# Patient Record
Sex: Male | Born: 1975
Health system: Southern US, Community
[De-identification: ages and names within clinical notes are randomized; demographics above are authoritative.]

## PROBLEM LIST (undated history)

## (undated) DIAGNOSIS — M109 Gout, unspecified: Secondary | ICD-10-CM

## (undated) DIAGNOSIS — I10 Essential (primary) hypertension: Secondary | ICD-10-CM

## (undated) DIAGNOSIS — A879 Viral meningitis, unspecified: Secondary | ICD-10-CM

## (undated) DIAGNOSIS — G039 Meningitis, unspecified: Secondary | ICD-10-CM

## (undated) HISTORY — DX: Essential (primary) hypertension: I10

## (undated) HISTORY — PX: BICEPS TENDON REPAIR: SHX566

---

## 2009-06-26 DIAGNOSIS — M109 Gout, unspecified: Secondary | ICD-10-CM

## 2009-06-26 HISTORY — DX: Gout, unspecified: M10.9

## 2012-03-13 ENCOUNTER — Emergency Department (HOSPITAL_COMMUNITY)
Admission: EM | Admit: 2012-03-13 | Discharge: 2012-03-13 | Disposition: A | Discharge status: Home / Self Care | Payer: Self-pay | Attending: Emergency Medicine | Admitting: Emergency Medicine

## 2012-03-13 DIAGNOSIS — Z8661 Personal history of infections of the central nervous system: Secondary | ICD-10-CM | POA: Insufficient documentation

## 2012-03-13 DIAGNOSIS — F17200 Nicotine dependence, unspecified, uncomplicated: Secondary | ICD-10-CM | POA: Insufficient documentation

## 2012-03-13 DIAGNOSIS — M109 Gout, unspecified: Secondary | ICD-10-CM | POA: Insufficient documentation

## 2012-03-13 DIAGNOSIS — I10 Essential (primary) hypertension: Secondary | ICD-10-CM | POA: Insufficient documentation

## 2012-03-13 HISTORY — DX: Gout, unspecified: M10.9

## 2012-03-13 HISTORY — DX: Viral meningitis, unspecified: A87.9

## 2012-03-13 MED ORDER — PREDNISONE 20 MG PO TABS
60.0000 mg | ORAL_TABLET | Freq: Once | ORAL | Status: DC
  Administered 2012-03-13: 60 mg via ORAL
  Filled 2012-03-13: qty 3

## 2012-03-13 MED ORDER — IBUPROFEN 800 MG PO TABS
800.0000 mg | ORAL_TABLET | Freq: Once | ORAL | Status: DC
  Administered 2012-03-13: 800 mg via ORAL
  Filled 2012-03-13: qty 1

## 2012-03-13 MED ORDER — PREDNISONE 20 MG PO TABS: 60.0000 mg | ORAL_TABLET | Freq: Every day | ORAL | Status: DC

## 2012-03-13 MED ORDER — OXYCODONE-ACETAMINOPHEN 5-325 MG PO TABS: 1.0000 | ORAL_TABLET | Freq: Four times a day (QID) | ORAL | Status: DC | PRN

## 2012-03-13 NOTE — ED Notes (Signed)
Pt states gout pain began yesterday to left foot. Also asking if he can have something for a headache. Provider to see pt.

## 2012-03-13 NOTE — ED Provider Notes (Signed)
History  This chart was scribed for Tien B. Bernette Mayers, MD by Shari Heritage. The patient was seen in room [facility]. Patient's care was started at 1504.     CSN: [id number]  Arrival date & time 03/13/12  1152   First MD Initiated Contact with Patient 03/13/12 1504      Chief Complaint  Patient presents with  . Gout    The history is provided by the patient. No language interpreter was used.    Walter Stuart is a 36 y.o. male who presents to the Emergency Department complaining of moderate, constant, non-radiating left big toe pain onset yesterday. Patient denies any obvious injury. Patient has a history of gout in his left elbow, but he doesn't take any medications for treatment. Patient has been wearing a boot on his foot, but it hasn't provided any relief.  Patient is also complaining of a moderate HA. Other medical history includes HTN and viral meningitis.  Past Medical History  Diagnosis Date  . Hypertension   . Gout   . Viral meningitis     History reviewed. No pertinent past surgical history.  No family history on file.  History  Substance Use Topics  . Smoking status: Current Every Day Smoker  . Smokeless tobacco: Current User  . Alcohol Use: 1.2 oz/week    2 Cans of beer per week      Review of Systems A complete 10 system review of systems was obtained and all systems are negative except as noted in the HPI and PMH.   Allergies  Review of patient's allergies indicates no known allergies.  Home Medications  No current outpatient prescriptions on file.  BP 149/79  Pulse 81  Temp 98.5 F (36.9 C) (Oral)  Resp 20  Ht 5\' 6"  (1.676 m)  Wt 285 lb (129.275 kg)  BMI 46.00 kg/m2  SpO2 98%  Physical Exam  Nursing note and vitals reviewed. Constitutional: He is oriented to person, place, and time. He appears well-developed and well-nourished.  HENT:  Head: Normocephalic and atraumatic.  Eyes: EOM are normal. Pupils are equal, round, and reactive to  light.  Neck: Normal range of motion. Neck supple.  Cardiovascular: Normal rate, normal heart sounds and intact distal pulses.   Pulmonary/Chest: Effort normal and breath sounds normal.  Abdominal: Bowel sounds are normal. He exhibits no distension. There is no tenderness.  Musculoskeletal: Normal range of motion. He exhibits edema (Pain and swelling to L great toe) and tenderness.  Neurological: He is alert and oriented to person, place, and time. He has normal strength. No cranial nerve deficit or sensory deficit.  Skin: Skin is warm and dry. No rash noted.  Psychiatric: He has a normal mood and affect.    ED Course  Procedures (including critical care time) DIAGNOSTIC STUDIES: Oxygen Saturation is 98% on room air, normal by my interpretation.    COORDINATION OF CARE: 3:23pm- Patient informed of current plan for treatment and evaluation and agrees with plan at this time.    Labs Reviewed - No data to display  No results found.   1. Gout       MDM  Pt with history of gout, has had flare in L great toe for several days. Worse with movement and difficulty walking.      I personally performed the services described in the documentation, which were scribed in my presence. The recorded information has been reviewed and considered.     Zyeir B. Bernette Mayers, MD 03/13/12 [phone]

## 2012-03-13 NOTE — ED Notes (Signed)
Patient with h/o gout. C/o similar pain in left foot/ankle. Patient denies any injury.

## 2013-02-24 ENCOUNTER — Emergency Department (HOSPITAL_COMMUNITY)
Admission: EM | Admit: 2013-02-24 | Discharge: 2013-02-24 | Disposition: A | Discharge status: Home / Self Care | Payer: Self-pay | Attending: Emergency Medicine | Admitting: Emergency Medicine

## 2013-02-24 ENCOUNTER — Encounter (HOSPITAL_COMMUNITY): Payer: Self-pay | Admitting: *Deleted

## 2013-02-24 DIAGNOSIS — M109 Gout, unspecified: Secondary | ICD-10-CM | POA: Insufficient documentation

## 2013-02-24 DIAGNOSIS — F17200 Nicotine dependence, unspecified, uncomplicated: Secondary | ICD-10-CM | POA: Insufficient documentation

## 2013-02-24 DIAGNOSIS — Z8661 Personal history of infections of the central nervous system: Secondary | ICD-10-CM | POA: Insufficient documentation

## 2013-02-24 HISTORY — DX: Gout, unspecified: M10.9

## 2013-02-24 HISTORY — DX: Meningitis, unspecified: G03.9

## 2013-02-24 MED ORDER — OXYCODONE-ACETAMINOPHEN 5-325 MG PO TABS: 1.0000 | ORAL_TABLET | ORAL | Status: DC | PRN

## 2013-02-24 MED ORDER — PREDNISONE 20 MG PO TABS: ORAL_TABLET | ORAL | Status: DC

## 2013-02-24 NOTE — ED Provider Notes (Signed)
CSN: [id number]     Arrival date & time 02/24/13  1454 History   First MD Initiated Contact with Patient 02/24/13 1529     Chief Complaint  Patient presents with  . Foot Pain   (Consider location/radiation/quality/duration/timing/severity/associated sxs/prior Treatment) HPI Comments: Dellis Voght is a 37 y.o. male who presents to the Emergency Department complaining of pain and swelling to the right great toe that began 3-4 days ago.  States he has hx of gout and pain is similar to previous gout attack.  Pain is worse with weight bearing and improves with rest.  He has been taking Ibuprofen 800 mg w/o relief.  He denies injury, skin lesions or open wounds to his foot.  States the great toe is the only part of the foot that is painful.    Patient is a 37 y.o. male presenting with lower extremity pain. The history is provided by the patient.  Foot Pain This is a recurrent problem. The current episode started in the past 7 days. The problem occurs constantly. The problem has been unchanged. Associated symptoms include arthralgias and joint swelling. Pertinent negatives include no chills, fever, neck pain, numbness, rash, sore throat, vomiting or weakness. The symptoms are aggravated by standing and walking. He has tried nothing for the symptoms. The treatment provided no relief.    Past Medical History  Diagnosis Date  . Gout   . Meningitis    History reviewed. No pertinent past surgical history. History reviewed. No pertinent family history. History  Substance Use Topics  . Smoking status: Current Every Day Smoker  . Smokeless tobacco: Not on file  . Alcohol Use: Yes    Review of Systems  Constitutional: Negative for fever and chills.  HENT: Negative for sore throat and neck pain.   Gastrointestinal: Negative for vomiting.  Genitourinary: Negative for dysuria and difficulty urinating.  Musculoskeletal: Positive for joint swelling and arthralgias.  Skin: Negative for color change,  rash and wound.  Neurological: Negative for weakness and numbness.  All other systems reviewed and are negative.    Allergies  Review of patient's allergies indicates no known allergies.  Home Medications  No current outpatient prescriptions on file. BP 143/62  Pulse 77  Temp(Src) 98.1 F (36.7 C) (Oral)  Resp 17  Ht 5\' 6"  (1.676 m)  Wt 285 lb (129.275 kg)  BMI 46.02 kg/m2  SpO2 99% Physical Exam  Nursing note and vitals reviewed. Constitutional: He is oriented to person, place, and time. He appears well-developed and well-nourished. No distress.  HENT:  Head: Normocephalic and atraumatic.  Cardiovascular: Normal rate, regular rhythm, normal heart sounds and intact distal pulses.   No murmur heard. Pulmonary/Chest: Effort normal and breath sounds normal. No respiratory distress.  Musculoskeletal: He exhibits tenderness. He exhibits no edema.  ttp of the proximal right great toe and first metatarsal head.     DP pulse is brisk,distal sensation intact.  No erythema, abrasion, wounds, red streaks or bony deformity.  No proximal tenderness.  Neurological: He is alert and oriented to person, place, and time. He exhibits normal muscle tone. Coordination normal.  Skin: Skin is warm and dry.    ED Course  Procedures (including critical care time) Labs Review Labs Reviewed - No data to display Imaging Review No results found.  MDM  Previous ED chart reviewed.   ttp and mild STS at the head of the first metatarsal of the right foot extending to the great toe. No clinical signs of cellulitis , NV  intact.  Has hx of gout.  Pain reproduced with weight bearing and states pain feels similar to previous gout flares.  Patient appears stable for d/c, agrees to f/u with his PMD   Arlyne Brandes L. Jiovanna Frei, PA-C 02/24/13 1627

## 2013-02-24 NOTE — ED Notes (Signed)
Pain , swelling rt foot, says he has hx of gout, No known injury

## 2013-02-25 NOTE — ED Provider Notes (Signed)
Medical screening examination/treatment/procedure(s) were performed by non-physician practitioner and as supervising physician I was immediately available for consultation/collaboration.   Laray Anger, DO 02/25/13 1511

## 2013-04-23 ENCOUNTER — Telehealth: Payer: Self-pay | Admitting: Orthopedic Surgery

## 2013-04-23 NOTE — Telephone Encounter (Signed)
Walter Stuart called today to ask about an appointment .  He had been to [facility] 02/24/13 for gout.  He does not have any insurance and wanted to know how much money he would need to be seen.  Told him the minimum amount is $125.00 and he would need to make payment arrangements on the balance.  He said he will call back  Later to schedule

## 2013-04-23 NOTE — Telephone Encounter (Signed)
[facility] called today to inquire about an [facility] follow up appointment.  He had been to [facility] 02/24/13

## 2013-08-29 ENCOUNTER — Telehealth: Payer: Self-pay | Admitting: Family Medicine

## 2014-02-12 ENCOUNTER — Ambulatory Visit: Payer: Self-pay | Admitting: Orthopedic Surgery

## 2014-02-13 ENCOUNTER — Encounter (HOSPITAL_COMMUNITY): Payer: Self-pay | Admitting: Pharmacy Technician

## 2014-02-16 ENCOUNTER — Ambulatory Visit: Payer: Self-pay | Admitting: Orthopedic Surgery

## 2014-02-16 NOTE — H&P (Signed)
Walter Stuart is an 38 y.o. male.   Chief Complaint: L shoulder pain HPI: The patient is a 38 year old male who presents today for follow up of their arm pain. The patient is being followed for their left arm pain. They are 5 month(s) out from injury (DOI 08/06/2013). Symptoms reported today include: pain and pain at night. The patient feels that they are doing poorly and report their pain level to be 8 / 10. Current treatment includes: activity modification and NSAIDs. The following medication has been used for pain control: antiinflammatory medication. Note for "Follow-up arm pain": The patient is currently working full duties.  Followup for left elbow and shoulder. Still reports pain in the shoulder, popping, and clicking. Some weakness in the extremity. Overall, the elbow is much better.  Past Medical History  Diagnosis Date  . Gout   . Meningitis     No past surgical history on file.  No family history on file. Social History:  reports that he has been smoking.  He does not have any smokeless tobacco history on file. He reports that he drinks alcohol. He reports that he does not use illicit drugs.  Allergies: No Known Allergies   (Not in a hospital admission)  No results found for this or any previous visit (from the past 48 hour(s)). No results found.  Review of Systems  Constitutional: Negative.   HENT: Negative.   Eyes: Negative.   Respiratory: Negative.   Cardiovascular: Negative.   Gastrointestinal: Negative.   Genitourinary: Negative.   Musculoskeletal: Positive for joint pain.  Skin: Negative.   Neurological: Negative.   Psychiatric/Behavioral: Negative.     There were no vitals taken for this visit. Physical Exam  Constitutional: He is oriented to person, place, and time. He appears well-developed and well-nourished.  HENT:  Head: Normocephalic and atraumatic.  Eyes: Conjunctivae and EOM are normal. Pupils are equal, round, and reactive to light.  Neck:  Normal range of motion. Neck supple.  Cardiovascular: Normal rate and regular rhythm.   Respiratory: Effort normal and breath sounds normal.  GI: Soft. Bowel sounds are normal.  Musculoskeletal:  On exam of the shoulder he has impingement signs of the shoulder. Nontender over the AC joint. Some weakness in flexion. Equiovocal popping with loading of the glenohumeral joint. Elbow mildly tender over the lateral epicondyle. He is neurovascularly intact otherwise.  Neurological: He is alert and oriented to person, place, and time. He has normal reflexes.  Skin: Skin is warm and dry.  Psychiatric: He has a normal mood and affect.    MRI demonstrated a full-thickness tear of the long head of the biceps. Extensive tear of the superior labrum. Mild glenohumeral arthrosis. Mild AC arthrosis.  Assessment/Plan L shoulder labral tear, impingement  1. Symptomatic labral tears status post tear of the long head of the biceps. Impingement syndrome, left shoulder. 2. Resolving elbow strain.  Given he is 5 months status post persistent symptoms and significant tearing of the labrum and tear of the long head of the biceps, we have discussed arthroscopic debridement of the labrum and the stump of the biceps tendon. From his clinical history, he has had a full-thickness tear of the biceps tendon given the ecchymosis and swelling down into the arm. We discussed possible open rotation cuff repair if a tear is noted, but that is doubtful. We discussed outpatient procedure, utilization of a sling, sutures out in 2 weeks, motion in the shoulder as tolerated. Light duty within 2-4 weeks. Maximum   medical improvement at 8 weeks postoperatively. We feel this is related to his injury. There is no further treatment I would suggest at this point for his elbow. I gave him a prescription for Percocet to be taken as directed. Work status in the interim: No repetitive motion of the arm. No overhead work. He does have an elevated BMI.  No allergies to penicillin. History of meningitis. He is a tobacco user. We discussed tobacco cessation.  Plan left shoulder arthroscopy, SAD, labral debridement  Walter Stuart M. 02/16/2014, 8:58 PM

## 2014-02-17 ENCOUNTER — Encounter (INDEPENDENT_AMBULATORY_CARE_PROVIDER_SITE_OTHER): Payer: Self-pay

## 2014-02-17 ENCOUNTER — Encounter (HOSPITAL_COMMUNITY)
Admission: RE | Admit: 2014-02-17 | Discharge: 2014-02-17 | Disposition: A | Discharge status: Home / Self Care | Payer: Self-pay | Source: Ambulatory Visit | Attending: Specialist | Admitting: Specialist

## 2014-02-17 ENCOUNTER — Encounter (HOSPITAL_COMMUNITY): Payer: Self-pay

## 2014-02-17 DIAGNOSIS — Z01818 Encounter for other preprocedural examination: Secondary | ICD-10-CM | POA: Insufficient documentation

## 2014-02-17 DIAGNOSIS — M7580 Other shoulder lesions, unspecified shoulder: Principal | ICD-10-CM

## 2014-02-17 DIAGNOSIS — M24119 Other articular cartilage disorders, unspecified shoulder: Secondary | ICD-10-CM | POA: Insufficient documentation

## 2014-02-17 DIAGNOSIS — M25819 Other specified joint disorders, unspecified shoulder: Secondary | ICD-10-CM | POA: Insufficient documentation

## 2014-02-17 LAB — BASIC METABOLIC PANEL
Anion gap: 10 (ref 5–15)
BUN: 13 mg/dL (ref 6–23)
CALCIUM: 9.4 mg/dL (ref 8.4–10.5)
CO2: 28 mEq/L (ref 19–32)
CREATININE: 1.15 mg/dL (ref 0.50–1.35)
Chloride: 103 mEq/L (ref 96–112)
GFR, EST NON AFRICAN AMERICAN: 80 mL/min — AB (ref >90)
Glucose, Bld: 99 mg/dL (ref 70–99)
Potassium: 4.8 mEq/L (ref 3.7–5.3)
Sodium: 141 mEq/L (ref 137–147)

## 2014-02-17 LAB — CBC
HEMATOCRIT: 40.1 % (ref 39.0–52.0)
Hemoglobin: 13.5 g/dL (ref 13.0–17.0)
MCH: 29.4 pg (ref 26.0–34.0)
MCHC: 33.7 g/dL (ref 30.0–36.0)
MCV: 87.4 fL (ref 78.0–100.0)
Platelets: 280 10*3/uL (ref 150–400)
RBC: 4.59 MIL/uL (ref 4.22–5.81)
RDW: 12.8 % (ref 11.5–15.5)
WBC: 7.1 10*3/uL (ref 4.0–10.5)

## 2014-02-17 MED ORDER — DEXTROSE 5 % IV SOLN
3.0000 g | INTRAVENOUS | Status: AC
  Administered 2014-02-18: 3 g via INTRAVENOUS
  Filled 2014-02-17 (×2): qty 3000

## 2014-02-17 NOTE — Patient Instructions (Addendum)
22 Walter Stuart  02/17/2014   Your procedure is scheduled on: 8-26  -2015 Wednesday  Enter through [facility] Entrance and follow signs to [facility]. Arrive at     0800   AM.  Call this number if you have problems the morning of surgery: [phone]  Or Presurgical Testing [phone].   For Living Will and/or Health Care Power Attorney Forms: please provide copy for your medical record,may bring AM of surgery(Forms should be already notarized -we do not provide this service).(02-17-14  No information preferred today).     Do not eat food/ or drink: After Midnight.      Take these medicines the morning of surgery with A SIP OF WATER: Oxycodone.   Do not wear jewelry, make-up or nail polish.  Do not wear lotions, powders, or perfumes. You may wear deodorant.  Do not shave 48 hours(2 days) prior to first CHG shower(legs and under arms).(Shaving face and neck okay.)  Do not bring valuables to the hospital.(Hospital is not responsible for lost valuables).  Contacts, dentures or removable bridgework, body piercing, hair pins may not be worn into surgery.  Leave suitcase in the car. After surgery it may be brought to your room.  For patients admitted to the hospital, checkout time is 11:00 AM the day of discharge.(Restricted visitors-Any Persons displaying flu-like symptoms or illness).    Patients discharged the day of surgery will not be allowed to drive home. Must have responsible person with you x 24 hours once discharged.  Name and phone number of your driver:  Mother Walter Stuart- [phone] cell Special Instructions: CHG(Chlorhedine 4%-"Hibiclens","Betasept","Aplicare") Shower Use Special Wash: see special instructions.(avoid face and genitals)   Please read over the following fact sheets that you were given:  Incentive Spirometry Instruction.       ______________________    [facility] - Preparing for Surgery Before surgery, you can play an important role.   Because skin is not sterile, your skin needs to be as free of germs as possible.  You can reduce the number of germs on your skin by washing with CHG (chlorahexidine gluconate) soap before surgery.  CHG is an antiseptic cleaner which kills germs and bonds with the skin to continue killing germs even after washing. Please DO NOT use if you have an allergy to CHG or antibacterial soaps.  If your skin becomes reddened/irritated stop using the CHG and inform your nurse when you arrive at Short Stay. Do not shave (including legs and underarms) for at least 48 hours prior to the first CHG shower.  You may shave your face/neck. Please follow these instructions carefully:  1.  Shower with CHG Soap the night before surgery and the  morning of Surgery.  2.  If you choose to wash your hair, wash your hair first as usual with your  normal  shampoo.  3.  After you shampoo, rinse your hair and body thoroughly to remove the  shampoo.                           4.  Use CHG as you would any other liquid soap.  You can apply chg directly  to the skin and wash                       Gently with a scrungie or clean washcloth.  5.  Apply the CHG Soap to your body ONLY FROM THE  NECK DOWN.   Do not use on face/ open                           Wound or open sores. Avoid contact with eyes, ears mouth and genitals (private parts).                       Wash face,  Genitals (private parts) with your normal soap.             6.  Wash thoroughly, paying special attention to the area where your surgery  will be performed.  7.  Thoroughly rinse your body with warm water from the neck down.  8.  DO NOT shower/wash with your normal soap after using and rinsing off  the CHG Soap.                9.  Pat yourself dry with a clean towel.            10.  Wear clean pajamas.            11.  Place clean sheets on your bed the night of your first shower and do not  sleep with pets. Day of Surgery : Do not apply any lotions/deodorants the  morning of surgery.  Please wear clean clothes to the hospital/surgery center.  FAILURE TO FOLLOW THESE INSTRUCTIONS MAY RESULT IN THE CANCELLATION OF YOUR SURGERY PATIENT SIGNATURE_________________________________  NURSE SIGNATURE__________________________________  ________________________________________________________________________   Rogelia Mire  An incentive spirometer is a tool that can help keep your lungs clear and active. This tool measures how well you are filling your lungs with each breath. Taking long deep breaths may help reverse or decrease the chance of developing breathing (pulmonary) problems (especially infection) following:  A long period of time when you are unable to move or be active. BEFORE THE PROCEDURE   If the spirometer includes an indicator to show your best effort, your nurse or respiratory therapist will set it to a desired goal.  If possible, sit up straight or lean slightly forward. Try not to slouch.  Hold the incentive spirometer in an upright position. INSTRUCTIONS FOR USE  1. Sit on the edge of your bed if possible, or sit up as far as you can in bed or on a chair. 2. Hold the incentive spirometer in an upright position. 3. Breathe out normally. 4. Place the mouthpiece in your mouth and seal your lips tightly around it. 5. Breathe in slowly and as deeply as possible, raising the piston or the ball toward the top of the column. 6. Hold your breath for 3-5 seconds or for as long as possible. Allow the piston or ball to fall to the bottom of the column. 7. Remove the mouthpiece from your mouth and breathe out normally. 8. Rest for a few seconds and repeat Steps 1 through 7 at least 10 times every 1-2 hours when you are awake. Take your time and take a few normal breaths between deep breaths. 9. The spirometer may include an indicator to show your best effort. Use the indicator as a goal to work toward during each repetition. 10. After each  set of 10 deep breaths, practice coughing to be sure your lungs are clear. If you have an incision (the cut made at the time of surgery), support your incision when coughing by placing a pillow or rolled up towels firmly against it. Once you are  able to get out of bed, walk around indoors and cough well. You may stop using the incentive spirometer when instructed by your caregiver.  RISKS AND COMPLICATIONS  Take your time so you do not get dizzy or light-headed.  If you are in pain, you may need to take or ask for pain medication before doing incentive spirometry. It is harder to take a deep breath if you are having pain. AFTER USE  Rest and breathe slowly and easily.  It can be helpful to keep track of a log of your progress. Your caregiver can provide you with a simple table to help with this. If you are using the spirometer at home, follow these instructions: [place] IF:   You are having difficultly using the spirometer.  You have trouble using the spirometer as often as instructed.  Your pain medication is not giving enough relief while using the spirometer.  You develop fever of 100.5 F (38.1 C) or higher. SEEK IMMEDIATE MEDICAL CARE IF:   You cough up bloody sputum that had not been present before.  You develop fever of 102 F (38.9 C) or greater.  You develop worsening pain at or near the incision site. MAKE SURE YOU:   Understand these instructions.  Will watch your condition.  Will get help right away if you are not doing well or get worse. Document Released: 10/23/2006 Document Revised: 09/04/2011 Document Reviewed: 12/24/2006 [facility] Patient Information 2014 [place], [place].   ________________________________________________________________________

## 2014-02-18 ENCOUNTER — Encounter (HOSPITAL_COMMUNITY): Payer: Worker's Compensation | Admitting: Certified Registered Nurse Anesthetist

## 2014-02-18 ENCOUNTER — Ambulatory Visit (HOSPITAL_COMMUNITY): Payer: Worker's Compensation | Admitting: Certified Registered Nurse Anesthetist

## 2014-02-18 ENCOUNTER — Encounter (HOSPITAL_COMMUNITY): Payer: Self-pay | Admitting: *Deleted

## 2014-02-18 ENCOUNTER — Ambulatory Visit (HOSPITAL_COMMUNITY)
Admission: RE | Admit: 2014-02-18 | Discharge: 2014-02-18 | Disposition: A | Discharge status: Home / Self Care | Payer: Worker's Compensation | Source: Ambulatory Visit | Attending: Specialist | Admitting: Specialist

## 2014-02-18 ENCOUNTER — Encounter (HOSPITAL_COMMUNITY)
Admission: RE | Disposition: A | Discharge status: Home / Self Care | Payer: Self-pay | Source: Ambulatory Visit | Attending: Specialist

## 2014-02-18 DIAGNOSIS — F17200 Nicotine dependence, unspecified, uncomplicated: Secondary | ICD-10-CM | POA: Insufficient documentation

## 2014-02-18 DIAGNOSIS — Y9269 Other specified industrial and construction area as the place of occurrence of the external cause: Secondary | ICD-10-CM | POA: Insufficient documentation

## 2014-02-18 DIAGNOSIS — M7580 Other shoulder lesions, unspecified shoulder: Secondary | ICD-10-CM

## 2014-02-18 DIAGNOSIS — M7542 Impingement syndrome of left shoulder: Secondary | ICD-10-CM

## 2014-02-18 DIAGNOSIS — S43439A Superior glenoid labrum lesion of unspecified shoulder, initial encounter: Secondary | ICD-10-CM | POA: Insufficient documentation

## 2014-02-18 DIAGNOSIS — S56819A Strain of other muscles, fascia and tendons at forearm level, unspecified arm, initial encounter: Secondary | ICD-10-CM

## 2014-02-18 DIAGNOSIS — Y9389 Activity, other specified: Secondary | ICD-10-CM | POA: Insufficient documentation

## 2014-02-18 DIAGNOSIS — Y990 Civilian activity done for income or pay: Secondary | ICD-10-CM | POA: Insufficient documentation

## 2014-02-18 DIAGNOSIS — S53499A Other sprain of unspecified elbow, initial encounter: Secondary | ICD-10-CM | POA: Insufficient documentation

## 2014-02-18 DIAGNOSIS — M25819 Other specified joint disorders, unspecified shoulder: Secondary | ICD-10-CM | POA: Insufficient documentation

## 2014-02-18 DIAGNOSIS — Z8661 Personal history of infections of the central nervous system: Secondary | ICD-10-CM | POA: Insufficient documentation

## 2014-02-18 DIAGNOSIS — X500XXA Overexertion from strenuous movement or load, initial encounter: Secondary | ICD-10-CM | POA: Insufficient documentation

## 2014-02-18 DIAGNOSIS — M66329 Spontaneous rupture of flexor tendons, unspecified upper arm: Secondary | ICD-10-CM | POA: Insufficient documentation

## 2014-02-18 DIAGNOSIS — M109 Gout, unspecified: Secondary | ICD-10-CM | POA: Insufficient documentation

## 2014-02-18 HISTORY — PX: SHOULDER ARTHROSCOPY WITH SUBACROMIAL DECOMPRESSION: SHX5684

## 2014-02-18 SURGERY — SHOULDER ARTHROSCOPY WITH SUBACROMIAL DECOMPRESSION
Anesthesia: General | Site: Shoulder | Laterality: Left

## 2014-02-18 MED ORDER — PROMETHAZINE HCL 25 MG/ML IJ SOLN: 6.2500 mg | INTRAMUSCULAR | Status: DC | PRN

## 2014-02-18 MED ORDER — ONDANSETRON HCL 4 MG/2ML IJ SOLN
INTRAMUSCULAR | Status: DC | PRN
  Administered 2014-02-18 (×2): 2 mg via INTRAVENOUS

## 2014-02-18 MED ORDER — OXYCODONE HCL 5 MG PO TABS
2.5000 mg | ORAL_TABLET | Freq: Once | ORAL | Status: AC
  Administered 2014-02-18: 2.5 mg via ORAL
  Filled 2014-02-18: qty 1

## 2014-02-18 MED ORDER — PROPOFOL 10 MG/ML IV BOLUS
INTRAVENOUS | Status: DC | PRN
  Administered 2014-02-18: 225 mg via INTRAVENOUS

## 2014-02-18 MED ORDER — MIDAZOLAM HCL 5 MG/5ML IJ SOLN
INTRAMUSCULAR | Status: DC | PRN
  Administered 2014-02-18 (×2): 1 mg via INTRAVENOUS

## 2014-02-18 MED ORDER — BUPIVACAINE-EPINEPHRINE 0.5% -1:200000 IJ SOLN
INTRAMUSCULAR | Status: DC | PRN
  Administered 2014-02-18: 30 mL

## 2014-02-18 MED ORDER — HYDROMORPHONE HCL PF 1 MG/ML IJ SOLN: 0.2500 mg | INTRAMUSCULAR | Status: DC | PRN

## 2014-02-18 MED ORDER — LIDOCAINE HCL (CARDIAC) 20 MG/ML IV SOLN
INTRAVENOUS | Status: AC
  Filled 2014-02-18: qty 5

## 2014-02-18 MED ORDER — NEOSTIGMINE METHYLSULFATE 10 MG/10ML IV SOLN
INTRAVENOUS | Status: AC
  Filled 2014-02-18: qty 1

## 2014-02-18 MED ORDER — OXYCODONE-ACETAMINOPHEN 7.5-325 MG PO TABS
1.0000 | ORAL_TABLET | ORAL | Status: DC | PRN
Start: 2014-02-18 — End: 2015-05-24

## 2014-02-18 MED ORDER — NEOSTIGMINE METHYLSULFATE 10 MG/10ML IV SOLN
INTRAVENOUS | Status: DC | PRN
  Administered 2014-02-18: 3 mg via INTRAVENOUS

## 2014-02-18 MED ORDER — GLYCOPYRROLATE 0.2 MG/ML IJ SOLN
INTRAMUSCULAR | Status: DC | PRN
  Administered 2014-02-18: .8 mg via INTRAVENOUS

## 2014-02-18 MED ORDER — CISATRACURIUM BESYLATE (PF) 10 MG/5ML IV SOLN
INTRAVENOUS | Status: DC | PRN
  Administered 2014-02-18: 8 mg via INTRAVENOUS

## 2014-02-18 MED ORDER — LACTATED RINGERS IV SOLN
INTRAVENOUS | Status: DC
  Administered 2014-02-18: 1000 mL via INTRAVENOUS
  Administered 2014-02-18: 12:00:00 via INTRAVENOUS

## 2014-02-18 MED ORDER — OXYCODONE-ACETAMINOPHEN 5-325 MG PO TABS
1.0000 | ORAL_TABLET | Freq: Once | ORAL | Status: AC
  Administered 2014-02-18: 1 via ORAL
  Filled 2014-02-18: qty 1

## 2014-02-18 MED ORDER — DEXAMETHASONE SODIUM PHOSPHATE 10 MG/ML IJ SOLN
INTRAMUSCULAR | Status: DC | PRN
  Administered 2014-02-18: 10 mg via INTRAVENOUS

## 2014-02-18 MED ORDER — PROPOFOL 10 MG/ML IV BOLUS
INTRAVENOUS | Status: AC
  Filled 2014-02-18: qty 20

## 2014-02-18 MED ORDER — CISATRACURIUM BESYLATE 20 MG/10ML IV SOLN
INTRAVENOUS | Status: AC
  Filled 2014-02-18: qty 10

## 2014-02-18 MED ORDER — SUCCINYLCHOLINE CHLORIDE 20 MG/ML IJ SOLN
INTRAMUSCULAR | Status: DC | PRN
  Administered 2014-02-18: 140 mg via INTRAVENOUS

## 2014-02-18 MED ORDER — LACTATED RINGERS IR SOLN
Status: DC | PRN
  Administered 2014-02-18: 6000 mL

## 2014-02-18 MED ORDER — EPINEPHRINE HCL 1 MG/ML IJ SOLN
INTRAMUSCULAR | Status: AC
  Filled 2014-02-18: qty 2

## 2014-02-18 MED ORDER — FENTANYL CITRATE 0.05 MG/ML IJ SOLN
INTRAMUSCULAR | Status: AC
  Filled 2014-02-18: qty 5

## 2014-02-18 MED ORDER — KETOROLAC TROMETHAMINE 10 MG PO TABS: 10.0000 mg | ORAL_TABLET | Freq: Four times a day (QID) | ORAL | Status: DC | PRN

## 2014-02-18 MED ORDER — PHENYLEPHRINE HCL 10 MG/ML IJ SOLN
INTRAMUSCULAR | Status: AC
  Filled 2014-02-18: qty 1

## 2014-02-18 MED ORDER — METHOCARBAMOL 500 MG PO TABS: 500.0000 mg | ORAL_TABLET | Freq: Three times a day (TID) | ORAL | Status: DC | PRN

## 2014-02-18 MED ORDER — EPINEPHRINE HCL 1 MG/ML IJ SOLN
INTRAMUSCULAR | Status: DC | PRN
  Administered 2014-02-18: 2 mL

## 2014-02-18 MED ORDER — FENTANYL CITRATE 0.05 MG/ML IJ SOLN
INTRAMUSCULAR | Status: DC | PRN
  Administered 2014-02-18 (×3): 50 ug via INTRAVENOUS

## 2014-02-18 MED ORDER — DEXAMETHASONE SODIUM PHOSPHATE 10 MG/ML IJ SOLN
INTRAMUSCULAR | Status: AC
  Filled 2014-02-18: qty 1

## 2014-02-18 MED ORDER — BUPIVACAINE-EPINEPHRINE (PF) 0.5% -1:200000 IJ SOLN
INTRAMUSCULAR | Status: AC
  Filled 2014-02-18: qty 30

## 2014-02-18 MED ORDER — LIDOCAINE HCL (CARDIAC) 20 MG/ML IV SOLN
INTRAVENOUS | Status: DC | PRN
  Administered 2014-02-18: 75 mg via INTRAVENOUS

## 2014-02-18 MED ORDER — MIDAZOLAM HCL 2 MG/2ML IJ SOLN
INTRAMUSCULAR | Status: AC
  Filled 2014-02-18: qty 2

## 2014-02-18 MED ORDER — ONDANSETRON HCL 4 MG/2ML IJ SOLN
INTRAMUSCULAR | Status: AC
  Filled 2014-02-18: qty 2

## 2014-02-18 MED ORDER — GLYCOPYRROLATE 0.2 MG/ML IJ SOLN
INTRAMUSCULAR | Status: AC
  Filled 2014-02-18: qty 3

## 2014-02-18 SURGICAL SUPPLY — 27 items
BLADE 4.2CUDA (BLADE) ×3 IMPLANT
BLADE SURG SZ11 CARB STEEL (BLADE) ×3 IMPLANT
BUR OVAL 4.0 (BURR) IMPLANT
CANNULA ACUFO 5X76 (CANNULA) ×3 IMPLANT
CLOTH 2% CHLOROHEXIDINE 3PK (PERSONAL CARE ITEMS) ×3 IMPLANT
DRAPE STERI 35X30 U-POUCH (DRAPES) ×3 IMPLANT
DRAPE U-SHAPE 47X51 STRL (DRAPES) ×3 IMPLANT
DRSG AQUACEL AG ADV 3.5X 4 (GAUZE/BANDAGES/DRESSINGS) ×3 IMPLANT
DRSG AQUACEL AG ADV 3.5X 6 (GAUZE/BANDAGES/DRESSINGS) ×3 IMPLANT
DURAPREP 26ML APPLICATOR (WOUND CARE) ×3 IMPLANT
GLOVE BIOGEL PI IND STRL 7.5 (GLOVE) ×1 IMPLANT
GLOVE BIOGEL PI INDICATOR 7.5 (GLOVE) ×2
GLOVE SURG SS PI 7.5 STRL IVOR (GLOVE) ×3 IMPLANT
GLOVE SURG SS PI 8.0 STRL IVOR (GLOVE) ×3 IMPLANT
GOWN STRL REUS W/TWL XL LVL3 (GOWN DISPOSABLE) ×6 IMPLANT
KIT POSITION SHOULDER SCHLEI (MISCELLANEOUS) ×3 IMPLANT
MANIFOLD NEPTUNE II (INSTRUMENTS) ×3 IMPLANT
NEEDLE SPNL 18GX3.5 QUINCKE PK (NEEDLE) ×3 IMPLANT
PACK SHOULDER CUSTOM OPM052 (CUSTOM PROCEDURE TRAY) ×3 IMPLANT
POSITIONER SURGICAL ARM (MISCELLANEOUS) ×3 IMPLANT
SET ARTHROSCOPY TUBING (MISCELLANEOUS) ×2
SET ARTHROSCOPY TUBING LN (MISCELLANEOUS) ×1 IMPLANT
SLING ARM IMMOBILIZER LRG (SOFTGOODS) ×3 IMPLANT
SUT ETHILON 4 0 PS 2 18 (SUTURE) ×3 IMPLANT
TUBING CONNECTING 10 (TUBING) ×2 IMPLANT
TUBING CONNECTING 10' (TUBING) ×1
WAND 90 DEG TURBOVAC W/CORD (SURGICAL WAND) IMPLANT

## 2014-02-18 NOTE — Anesthesia Preprocedure Evaluation (Addendum)
Anesthesia Evaluation  Patient identified by MRN, date of birth, ID band Patient awake    Reviewed: Allergy & Precautions, H&P , NPO status , Patient's Chart, lab work & pertinent test results  Airway Mallampati: IV TM Distance: >3 FB Neck ROM: Full  Mouth opening: Limited Mouth Opening  Dental  (+) Teeth Intact, Dental Advisory Given   Pulmonary Current Smoker,  breath sounds clear to auscultation        Cardiovascular negative cardio ROS  Rhythm:Regular Rate:Normal     Neuro/Psych negative neurological ROS  negative psych ROS   GI/Hepatic negative GI ROS, Neg liver ROS,   Endo/Other  Morbid obesity  Renal/GU Renal InsufficiencyRenal diseasenegative Renal ROS     Musculoskeletal negative musculoskeletal ROS (+)   Abdominal   Peds  Hematology negative hematology ROS (+)   Anesthesia Other Findings   Reproductive/Obstetrics negative OB ROS                          Anesthesia Physical Anesthesia Plan  ASA: III  Anesthesia Plan: General   Post-op Pain Management:    Induction: Intravenous  Airway Management Planned: Oral ETT  Additional Equipment: None  Intra-op Plan:   Post-operative Plan: Extubation in OR  Informed Consent: I have reviewed the patients History and Physical, chart, labs and discussed the procedure including the risks, benefits and alternatives for the proposed anesthesia with the patient or authorized representative who has indicated his/her understanding and acceptance.   Dental advisory given  Plan Discussed with: CRNA and Surgeon  Anesthesia Plan Comments:        Anesthesia Quick Evaluation

## 2014-02-18 NOTE — H&P (View-Only) (Signed)
Walter Stuart is an 38 y.o. male.   Chief Complaint: L shoulder pain HPI: The patient is a 39 year old male who presents today for follow up of their arm pain. The patient is being followed for their left arm pain. They are 5 month(s) out from injury (DOI 08/06/2013). Symptoms reported today include: pain and pain at night. The patient feels that they are doing poorly and report their pain level to be 8 / 10. Current treatment includes: activity modification and NSAIDs. The following medication has been used for pain control: antiinflammatory medication. Note for "Follow-up arm pain": The patient is currently working full duties.  Followup for left elbow and shoulder. Still reports pain in the shoulder, popping, and clicking. Some weakness in the extremity. Overall, the elbow is much better.  Past Medical History  Diagnosis Date  . Gout   . Meningitis     No past surgical history on file.  No family history on file. Social History:  reports that he has been smoking.  He does not have any smokeless tobacco history on file. He reports that he drinks alcohol. He reports that he does not use illicit drugs.  Allergies: No Known Allergies   (Not in a hospital admission)  No results found for this or any previous visit (from the past 48 hour(s)). No results found.  Review of Systems  Constitutional: Negative.   HENT: Negative.   Eyes: Negative.   Respiratory: Negative.   Cardiovascular: Negative.   Gastrointestinal: Negative.   Genitourinary: Negative.   Musculoskeletal: Positive for joint pain.  Skin: Negative.   Neurological: Negative.   Psychiatric/Behavioral: Negative.     There were no vitals taken for this visit. Physical Exam  Constitutional: He is oriented to person, place, and time. He appears well-developed and well-nourished.  HENT:  Head: Normocephalic and atraumatic.  Eyes: Conjunctivae and EOM are normal. Pupils are equal, round, and reactive to light.  Neck:  Normal range of motion. Neck supple.  Cardiovascular: Normal rate and regular rhythm.   Respiratory: Effort normal and breath sounds normal.  GI: Soft. Bowel sounds are normal.  Musculoskeletal:  On exam of the shoulder he has impingement signs of the shoulder. Nontender over the [facility] joint. Some weakness in flexion. Equiovocal popping with loading of the glenohumeral joint. Elbow mildly tender over the lateral epicondyle. He is neurovascularly intact otherwise.  Neurological: He is alert and oriented to person, place, and time. He has normal reflexes.  Skin: Skin is warm and dry.  Psychiatric: He has a normal mood and affect.    MRI demonstrated a full-thickness tear of the long head of the biceps. Extensive tear of the superior labrum. Mild glenohumeral arthrosis. Mild AC arthrosis.  Assessment/Plan L shoulder labral tear, impingement  1. Symptomatic labral tears status post tear of the long head of the biceps. Impingement syndrome, left shoulder. 2. Resolving elbow strain.  Given he is 5 months status post persistent symptoms and significant tearing of the labrum and tear of the long head of the biceps, we have discussed arthroscopic debridement of the labrum and the stump of the biceps tendon. From his clinical history, he has had a full-thickness tear of the biceps tendon given the ecchymosis and swelling down into the arm. We discussed possible open rotation cuff repair if a tear is noted, but that is doubtful. We discussed outpatient procedure, utilization of a sling, sutures out in 2 weeks, motion in the shoulder as tolerated. Light duty within 2-4 weeks. Maximum  medical improvement at 8 weeks postoperatively. We feel this is related to his injury. There is no further treatment I would suggest at this point for his elbow. I gave him a prescription for Percocet to be taken as directed. Work status in the interim: No repetitive motion of the arm. No overhead work. He does have an elevated BMI.  No allergies to penicillin. History of meningitis. He is a tobacco user. We discussed tobacco cessation.  Plan left shoulder arthroscopy, SAD, labral debridement  Darald Uzzle M. 02/16/2014, 8:58 PM

## 2014-02-18 NOTE — Anesthesia Postprocedure Evaluation (Signed)
  Anesthesia Post-op Note  Patient: Walter Stuart  Procedure(s) Performed: Procedure(s): LEFT SHOULDER ARTHROSCOPY WITH SUBACROMIAL DECOMPRESSION, DEBRIDEMENT OF LABRAL TEAR AND BICEPS TENDON (Left)  Patient Location: PACU  Anesthesia Type:General  Level of Consciousness: awake, alert  and oriented  Airway and Oxygen Therapy: Patient Spontanous Breathing  Post-op Pain: none  Post-op Assessment: Post-op Vital signs reviewed  Post-op Vital Signs: Reviewed  Last Vitals:  Filed Vitals:   02/18/14 1330  BP: 157/59  Pulse: 60  Temp: 36.8 C  Resp: 16    Complications: No apparent anesthesia complications

## 2014-02-18 NOTE — Discharge Instructions (Signed)
SHOULDER ARTHROSCOPY POSTOPERATIVE INSTRUCTIONS FOR DR. Chinelo Benn ° °1.  Ice pack on shoulder 3-4 times per day. ° °2.  May remove bandages in 72 hours and apply band-aids to sutures. ° °3,  May get out of sling in AM and start gentle pendulum exercises.  You are encouraged       to move the elbow, wrist and hand. ° °4.  Elevate operative shoulder and elbow on pillow. ° °5.  Exercise your fingers to help reduce swelling. ° °6.  Report to your doctor should any of the following situations occur: ° ° -Swelling of your fingers. ° -Inability to wiggle your fingers. ° -Coldness, turning pale or blueness of your fingers. ° -Loss of sensation, numbness or tingling of your fingers. ° -Unusual small or odor from under dressing. ° -Excessive bleeding or drainage from the surgical site(s). ° -Severe pain which is not relieved by the pain medication your doctor prescribed                for you. ° °7.  Call the office to schedule and appointment for 14 . °8.  Take one aspirin per day 325mg with a meal if not on another blood thinner or allergic. ° °Patient Signature:  ________________________________________________________ ° °Nurse's Signature:  ________________________________________________________ ° °

## 2014-02-18 NOTE — Brief Op Note (Signed)
02/18/2014  11:52 AM  PATIENT:  Altamease Oiler  38 y.o. male  PRE-OPERATIVE DIAGNOSIS:  LABRAL TEAR IMPINGEMENT SYNDROME LEFT SHOULDER   POST-OPERATIVE DIAGNOSIS:  LABRAL TEAR IMPINGEMENT SYNDROME LEFT SHOULDER   PROCEDURE:  Procedure(s): LEFT SHOULDER ARTHROSCOPY WITH SUBACROMIAL DECOMPRESSION, DEBRIDEMENT OF LABRAL TEAR AND BICEPS TENDON (Left)  SURGEON:  Surgeon(s) and Role:    * Javier Docker, MD - Primary  PHYSICIAN ASSISTANT:   ASSISTANTS: Bissell   ANESTHESIA:   general  EBL:  Total I/O In: 1000 [I.V.:1000] Out: -   BLOOD ADMINISTERED:none  DRAINS: none   LOCAL MEDICATIONS USED:  MARCAINE     SPECIMEN:  No Specimen  DISPOSITION OF SPECIMEN:  N/A  COUNTS:  YES  TOURNIQUET:  * No tourniquets in log *  DICTATION: .Other Dictation: Dictation Number [id number]  PLAN OF CARE: Discharge to home after PACU  PATIENT DISPOSITION:  PACU - hemodynamically stable.   Delay start of Pharmacological VTE agent (>24hrs) due to surgical blood loss or risk of bleeding: no

## 2014-02-18 NOTE — Interval H&P Note (Signed)
History and Physical Interval Note:  02/18/2014 7:21 AM  Walter Stuart  has presented today for surgery, with the diagnosis of LABRAL TEAR IMPINGEMENT SYNDROME LEFT SHOULDER   The various methods of treatment have been discussed with the patient and family. After consideration of risks, benefits and other options for treatment, the patient has consented to  Procedure(s): LEFT SHOULDER ARTHROSCOPY WITH SUBACROMIAL DECOMPRESSION DEBRIDEMENT AND LABRAL TEAR  (Left) as a surgical intervention .  The patient's history has been reviewed, patient examined, no change in status, stable for surgery.  I have reviewed the patient's chart and labs.  Questions were answered to the patient's satisfaction.     [name] C

## 2014-02-18 NOTE — Transfer of Care (Signed)
Immediate Anesthesia Transfer of Care Note  Patient: Giovanie Lefebre  Procedure(s) Performed: Procedure(s): LEFT SHOULDER ARTHROSCOPY WITH SUBACROMIAL DECOMPRESSION, DEBRIDEMENT OF LABRAL TEAR AND BICEPS TENDON (Left)  Patient Location: PACU  Anesthesia Type:General  Level of Consciousness: awake, alert , oriented and patient cooperative  Airway & Oxygen Therapy: Patient Spontanous Breathing and Patient connected to face mask oxygen  Post-op Assessment: Report given to PACU RN, Post -op Vital signs reviewed and stable and Patient moving all extremities  Post vital signs: Reviewed and stable  Complications: No apparent anesthesia complications

## 2014-02-19 ENCOUNTER — Encounter (HOSPITAL_COMMUNITY): Payer: Self-pay | Admitting: Specialist

## 2014-02-19 NOTE — Op Note (Signed)
NAME:  Walter Stuart, Walter Stuart NO.:  [id number]  MEDICAL RECORD NO.:  [id number]  LOCATION:  [facility]                         FACILITY:  [facility], [place]  PHYSICIAN:  Jene Every, M.D.    DATE OF BIRTH:  [birth year]  DATE OF PROCEDURE: DATE OF DISCHARGE:                              OPERATIVE REPORT   PREOPERATIVE DIAGNOSES:  Labral tear, biceps tendon tear of the left shoulder, impingement syndrome.  POSTOPERATIVE DIAGNOSES:  Labral tear, biceps tendon tear of the left shoulder, impingement syndrome.  PROCEDURE PERFORMED: 1. Left shoulder arthroscopy. 2. Debridement of biceps tendon tear. 3. Debridement of the anterior, superior, and posterior labrum. 4. Subacromial decompression bursectomy, acromioplasty.  ANESTHESIA:  General.  ASSISTANT:  Lanna Poche, PA.  HISTORY:  This is a 38 year old male with persistent pain in the left shoulder, impingement pain.  He had an injury at work; developed a hematoma down into the arm with an MRI indicating a near-complete tear, if not full tear, of the biceps tendon and labrum; had subacromial impingement type pain and bursitis.  He had failed conservative treatment and was indicated for arthroscopy, debridement of the biceps tendon and the labrum, and bursectomy and subacromial decompression. Risks and benefits were discussed including bleeding, infection, damage to neurovascular structures, DVT, PE, anesthetic complications, etc. The patient also had a degree of difficulty elevated by the patient's BMI.  His kilogram weight was 155 kg.  TECHNIQUE:  With the patient supine beach-chair position, after the induction of adequate general anesthesia, 3 g Kefzol, left shoulder and upper extremity was prepped and draped in usual sterile fashion.  He had ample subcutaneous adipose tissue.  We demarcated that with a surgical marker delineating the acromion AC joint.  We used a #11 blade and the standard posterolateral incision was  utilized to the skin only.  With the arm in the 70-30 position, we advanced the arthroscopic camera into the glenohumeral joint penetrating the capsule atraumatically.  This was infused with 65 mL of irrigant.  The shoulder was lavaged.  Under direct visualization, I fashioned an anterior portal localizing it with an 18- gauge needle.  Just beneath the region of the stump of the biceps tendon, a small incision was made.  I advanced the cannula in the glenohumeral joint without difficulty.  I introduced a 3.5 shaver and debrided the biceps tendon, which was completely torn and a stump remaining in the superior part of the glenoid.  This was debrided to the stable base.  Anterior, superior, and posterior labral residual tears were debrided to a stable base as well.  There was mild chondral injury to the glenohumeral joint, but there was no evidence of rotator cuff tear.  The subscap was unremarkable.  We copiously lavaged the joint. The remaining labral complex was stable.  I redirected the camera in the subacromial space, triangulated with an anterolateral cannula through an additional portal with an incision through the skin only.  We triangulated the subacromial space, we saw hypertrophic bursa and synovium.  We introduced a shaver and performed a full bursectomy anteriorly, superiorly, posteriorly, and laterally.  We inspected the tendon.  Minor fraying.  No significant tear on shaving of the anterolateral aspect of  the acromion, but he had a fairly good acromiohumeral space.  Released a part of the CA ligament, but again with a type 1 acromion and no significant hypertrophy of the CA ligament, we did not feel aggressive acromioplasty or resection of the CA ligament was necessary.  Both in the glenohumeral joint and in the subacromial space, we infused 10 mL of Marcaine.  I then removed all instrumentation.  Portals were closed with 4-0 nylon simple sutures.  A 0.25% Marcaine with  epinephrine was infiltrated around the portals. Sterile dressing was applied.  We closed these incisions with 4-0 nylon, placed in a sling, extubated without difficulty, and transported to the recovery room in satisfactory condition.  The patient tolerated the procedure well with no complications. Assistant Lanna Poche, [place] was used due to the patient's size to hold the arm, apply traction to obtain access to the glenohumeral joint, closure, manipulation, and monitoring the inflow and the outflow of the arthroscopic irrigant.     Jene Every, M.D.     [name]  D:  02/18/2014  T:  02/18/2014  Job:  [id number]

## 2015-02-25 DIAGNOSIS — I10 Essential (primary) hypertension: Secondary | ICD-10-CM

## 2015-02-25 HISTORY — DX: Essential (primary) hypertension: I10

## 2015-05-24 ENCOUNTER — Encounter: Payer: Self-pay | Admitting: Physician Assistant

## 2015-05-24 ENCOUNTER — Ambulatory Visit: Payer: Self-pay | Admitting: Physician Assistant

## 2015-05-24 VITALS — BP 178/96 | HR 83 | Temp 99.1°F | Ht 67.5 in | Wt 351.5 lb

## 2015-05-24 DIAGNOSIS — Z131 Encounter for screening for diabetes mellitus: Secondary | ICD-10-CM

## 2015-05-24 DIAGNOSIS — I10 Essential (primary) hypertension: Secondary | ICD-10-CM

## 2015-05-24 DIAGNOSIS — M109 Gout, unspecified: Secondary | ICD-10-CM

## 2015-05-24 DIAGNOSIS — Z13220 Encounter for screening for lipoid disorders: Secondary | ICD-10-CM

## 2015-05-24 LAB — GLUCOSE, POCT (MANUAL RESULT ENTRY): POC GLUCOSE: 73 mg/dL (ref 70–99)

## 2015-05-24 MED ORDER — LISINOPRIL-HYDROCHLOROTHIAZIDE 20-12.5 MG PO TABS: 1.0000 | ORAL_TABLET | Freq: Every day | ORAL | Status: DC

## 2015-05-24 NOTE — Progress Notes (Signed)
BP 178/96 mmHg  Pulse 83  Temp(Src) 99.1 F (37.3 C)  Ht 5' 7.5" (1.715 m)  Wt 351 lb 8 oz (159.439 kg)  BMI 54.21 kg/m2  SpO2 97%   Subjective:    Patient ID: Walter Stuart, male    DOB: 12/26/1975, 39 y.o.   MRN: [id number]  HPI: Walter OilerCharles Stuart is a 39 y.o. male presenting on 05/24/2015 for New Patient (Initial Visit); Hypertension; and Gout   HPI Chief Complaint  Patient presents with  . New Patient (Initial Visit)    pt takes BP meds, but does not know the name. pt did not bring meds.  . Hypertension  . Gout   -pt says he feels well.   He says he is here to get care for his htn  Relevant past medical, surgical, family and social history reviewed and updated as indicated. Interim medical history since our last visit reviewed. Allergies and medications reviewed and updated.  Current outpatient prescriptions:  .  amLODipine (NORVASC) 5 MG tablet, Take 5 mg by mouth daily., Disp: , Rfl:    Review of Systems  Constitutional: Positive for fever (subjective) and unexpected weight change. Negative for chills, diaphoresis, appetite change and fatigue.  HENT: Negative for congestion, dental problem, drooling, ear pain, facial swelling, hearing loss, mouth sores, sneezing, sore throat, trouble swallowing and voice change.   Eyes: Positive for redness, itching and visual disturbance. Negative for pain and discharge.  Respiratory: Positive for shortness of breath and wheezing. Negative for cough and choking.   Cardiovascular: Positive for chest pain. Negative for palpitations and leg swelling.  Gastrointestinal: Negative for vomiting, abdominal pain, diarrhea, constipation and blood in stool.  Endocrine: Negative for cold intolerance, heat intolerance and polydipsia.  Genitourinary: Negative for dysuria, hematuria and decreased urine volume.  Musculoskeletal: Positive for back pain and arthralgias. Negative for gait problem.  Skin: Negative for rash.  Allergic/Immunologic:  Negative for environmental allergies.  Neurological: Positive for headaches. Negative for seizures, syncope and light-headedness.  Hematological: Negative for adenopathy.  Psychiatric/Behavioral: Positive for dysphoric mood and agitation. Negative for suicidal ideas. The patient is not nervous/anxious.     Per HPI unless specifically indicated above     Objective:    BP 178/96 mmHg  Pulse 83  Temp(Src) 99.1 F (37.3 C)  Ht 5' 7.5" (1.715 m)  Wt 351 lb 8 oz (159.439 kg)  BMI 54.21 kg/m2  SpO2 97%  Wt Readings from Last 3 Encounters:  05/24/15 351 lb 8 oz (159.439 kg)  02/18/14 343 lb 6 oz (155.754 kg)  02/17/14 343 lb 6 oz (155.754 kg)    Physical Exam  Constitutional: He is oriented to person, place, and time. He appears well-developed and well-nourished.  HENT:  Head: Normocephalic and atraumatic.  Mouth/Throat: Oropharynx is clear and moist. No oropharyngeal exudate.  Eyes: Conjunctivae and EOM are normal. Pupils are equal, round, and reactive to light.  Neck: Neck supple. No thyromegaly present.  Cardiovascular: Normal rate and regular rhythm.   Pulmonary/Chest: Effort normal and breath sounds normal. He has no wheezes. He has no rales.  Abdominal: Soft. Bowel sounds are normal. He exhibits no mass. There is no tenderness.  Obese  Musculoskeletal: He exhibits no edema.  Lymphadenopathy:    He has no cervical adenopathy.  Neurological: He is alert and oriented to person, place, and time.  Skin: Skin is warm and dry. No rash noted.  Psychiatric: He has a normal mood and affect. His behavior is normal. Thought content normal.  Vitals reviewed.   Results for orders placed or performed in visit on 05/24/15  POCT Glucose (CBG)  Result Value Ref Range   POC Glucose 73 70 - 99 mg/dl      Assessment & Plan:   Encounter Diagnoses  Name Primary?  . Essential hypertension, benign Yes  . Screening for diabetes mellitus   . Screening cholesterol level   . Morbid obesity,  unspecified obesity type (HCC)   . Gout, unspecified cause, unspecified chronicity, unspecified site    -Stop the norvasc (because pt states it is expensive).  Start lisinopril/hctz -counseled on smoking cessation -counseled on wt mgt -get baseline labs -f/u OV 3 wk to recheck bp

## 2015-05-24 NOTE — Patient Instructions (Signed)
Obesity Obesity is defined as having too much total body fat and a body mass index (BMI) of 30 or more. BMI is an estimate of body fat and is calculated from your height and weight. BMI is typically calculated by your health care provider during regular wellness visits. Obesity happens when you consume more calories than you can burn by exercising or performing daily physical tasks. Prolonged obesity can cause major illnesses or emergencies, such as:  Stroke.  Heart disease.  Diabetes.  Cancer.  Arthritis.  High blood pressure (hypertension).  High cholesterol.  Sleep apnea.  Erectile dysfunction.  Infertility problems. CAUSES   Regularly eating unhealthy foods.  Physical inactivity.  Certain disorders, such as an underactive thyroid (hypothyroidism), Cushing's syndrome, and polycystic ovarian syndrome.  Certain medicines, such as steroids, some depression medicines, and antipsychotics.  Genetics.  Lack of sleep. DIAGNOSIS A health care provider can diagnose obesity after calculating your BMI. Obesity will be diagnosed if your BMI is 30 or higher. There are other methods of measuring obesity levels. Some other methods include measuring your skinfold thickness, your waist circumference, and comparing your hip circumference to your waist circumference. TREATMENT  A healthy treatment program includes some or all of the following:  Long-term dietary changes.  Exercise and physical activity.  Behavioral and lifestyle changes.  Medicine only under the supervision of your health care provider. Medicines may help, but only if they are used with diet and exercise programs. If your BMI is 40 or higher, your health care provider may recommend specialized surgery or programs to help with weight loss. An unhealthy treatment program includes:  Fasting.  Fad diets.  Supplements and drugs. These choices do not succeed in long-term weight control. HOME CARE  INSTRUCTIONS  Exercise and perform physical activity as directed by your health care provider. To increase physical activity, try the following:  Use stairs instead of elevators.  Park farther away from store entrances.  Garden, bike, or walk instead of watching television or using the computer.  Eat healthy, low-calorie foods and drinks on a regular basis. Eat more fruits and vegetables. Use low-calorie cookbooks or take healthy cooking classes.  Limit fast food, sweets, and processed snack foods.  Eat smaller portions.  Keep a daily journal of everything you eat. There are many free websites to help you with this. It may be helpful to measure your foods so you can determine if you are eating the correct portion sizes.  Avoid drinking alcohol. Drink more water and drinks without calories.  Take vitamins and supplements only as recommended by your health care provider.  Weight-loss support groups, registered dietitians, counselors, and stress reduction education can also be very helpful. SEEK IMMEDIATE MEDICAL CARE IF:  You have chest pain or tightness.  You have trouble breathing or feel short of breath.  You have weakness or leg numbness.  You feel confused or have trouble talking.  You have sudden changes in your vision.   This information is not intended to replace advice given to you by your health care provider. Make sure you discuss any questions you have with your health care provider.   Document Released: 07/20/2004 Document Revised: 07/03/2014 Document Reviewed: 07/19/2011 Elsevier Interactive Patient Education 2016 [employer]  

## 2015-06-23 ENCOUNTER — Ambulatory Visit: Payer: Self-pay | Admitting: Physician Assistant

## 2017-05-02 DIAGNOSIS — Z720 Tobacco use: Secondary | ICD-10-CM | POA: Diagnosis not present

## 2017-05-02 DIAGNOSIS — I10 Essential (primary) hypertension: Secondary | ICD-10-CM | POA: Diagnosis not present

## 2017-06-30 DIAGNOSIS — I10 Essential (primary) hypertension: Secondary | ICD-10-CM | POA: Diagnosis not present

## 2017-06-30 DIAGNOSIS — Z720 Tobacco use: Secondary | ICD-10-CM | POA: Diagnosis not present

## 2017-09-24 DIAGNOSIS — R0789 Other chest pain: Secondary | ICD-10-CM | POA: Diagnosis not present

## 2017-09-24 DIAGNOSIS — F17200 Nicotine dependence, unspecified, uncomplicated: Secondary | ICD-10-CM | POA: Diagnosis not present

## 2017-09-24 DIAGNOSIS — N390 Urinary tract infection, site not specified: Secondary | ICD-10-CM | POA: Diagnosis not present

## 2017-09-24 DIAGNOSIS — R079 Chest pain, unspecified: Secondary | ICD-10-CM | POA: Diagnosis not present

## 2017-09-24 DIAGNOSIS — I10 Essential (primary) hypertension: Secondary | ICD-10-CM | POA: Diagnosis not present

## 2017-09-24 DIAGNOSIS — Z79899 Other long term (current) drug therapy: Secondary | ICD-10-CM | POA: Diagnosis not present

## 2017-09-26 DIAGNOSIS — F17200 Nicotine dependence, unspecified, uncomplicated: Secondary | ICD-10-CM | POA: Diagnosis not present

## 2017-09-26 DIAGNOSIS — I10 Essential (primary) hypertension: Secondary | ICD-10-CM | POA: Diagnosis not present

## 2017-09-26 DIAGNOSIS — Z79899 Other long term (current) drug therapy: Secondary | ICD-10-CM | POA: Diagnosis not present

## 2017-09-26 DIAGNOSIS — N451 Epididymitis: Secondary | ICD-10-CM | POA: Diagnosis not present

## 2017-09-26 DIAGNOSIS — T783XXA Angioneurotic edema, initial encounter: Secondary | ICD-10-CM | POA: Diagnosis not present

## 2017-09-26 DIAGNOSIS — T7840XA Allergy, unspecified, initial encounter: Secondary | ICD-10-CM | POA: Diagnosis not present

## 2018-05-02 DIAGNOSIS — Z720 Tobacco use: Secondary | ICD-10-CM | POA: Diagnosis not present

## 2018-05-02 DIAGNOSIS — Z760 Encounter for issue of repeat prescription: Secondary | ICD-10-CM | POA: Diagnosis not present

## 2018-05-02 DIAGNOSIS — I10 Essential (primary) hypertension: Secondary | ICD-10-CM | POA: Diagnosis not present

## 2018-05-02 DIAGNOSIS — Z79899 Other long term (current) drug therapy: Secondary | ICD-10-CM | POA: Diagnosis not present

## 2018-11-11 ENCOUNTER — Encounter: Payer: Self-pay | Admitting: Family Medicine

## 2018-11-11 ENCOUNTER — Other Ambulatory Visit: Payer: Self-pay

## 2018-11-11 ENCOUNTER — Ambulatory Visit: Payer: BLUE CROSS/BLUE SHIELD | Admitting: Family Medicine

## 2018-11-11 VITALS — BP 147/80 | HR 77 | Temp 97.0°F | Ht 67.5 in | Wt 290.0 lb

## 2018-11-11 DIAGNOSIS — I10 Essential (primary) hypertension: Secondary | ICD-10-CM | POA: Diagnosis not present

## 2018-11-11 MED ORDER — NABUMETONE 500 MG PO TABS: 1000.0000 mg | ORAL_TABLET | Freq: Two times a day (BID) | ORAL | 1 refills | Status: DC

## 2018-11-11 MED ORDER — LABETALOL HCL 100 MG PO TABS: 100.0000 mg | ORAL_TABLET | Freq: Two times a day (BID) | ORAL | 2 refills | Status: DC

## 2018-11-11 NOTE — Progress Notes (Signed)
Subjective:  Patient ID: Walter Stuart, male    DOB: 02/01/1976  Age: 43 y.o. MRN: [id number]  CC: Establish Care and Hypertension   HPI Walter Stuart presents for follow-up of hypertension.  He has trouble getting off work from his employer and has been going to the emergency room the last few months to get refills on his medication.  He takes labetalol 100 mg.  Also noted is that he took lisinopril but said his lip swelled so that was discontinued.  Another one caused him to gain weight.  The only other medicine I see in his past is amlodipine he is not sure if that was the medicine or not.  Patient has been losing weight.  He needs to lose more.  He is continuing to look at dieting.  Does not have a specific plan at this time.  Patient has no history of headache chest pain or shortness of breath or recent cough. Patient also denies symptoms of TIA such as numbness weakness lateralizing. Patient checks  blood pressure at home and has not had any elevated readings recently. Patient denies side effects from his medication. States taking it regularly.   Depression screen [facility] 2/9 11/11/2018  Decreased Interest 0  Down, Depressed, Hopeless 0  PHQ - 2 Score 0    History Timm has a past medical history of Gout (2011), Hypertension (02-2015), and Meningitis.   He has a past surgical history that includes Shoulder arthroscopy with subacromial decompression (Left, 02/18/2014) and Biceps tendon repair (Left).   His family history is not on file.He reports that he has been smoking cigarettes. He has a 4.50 pack-year smoking history. He has never used smokeless tobacco. He reports current alcohol use. He reports current drug use. Drug: Marijuana.    ROS Review of Systems  Constitutional: Negative.   HENT: Negative.   Eyes: Negative for visual disturbance.  Respiratory: Negative for cough and shortness of breath.   Cardiovascular: Negative for chest pain and leg swelling.  Gastrointestinal:  Negative for abdominal pain, diarrhea, nausea and vomiting.  Genitourinary: Negative for difficulty urinating.  Musculoskeletal: Negative for arthralgias and myalgias.  Skin: Negative for rash.  Neurological: Negative for headaches.  Psychiatric/Behavioral: Negative for sleep disturbance.    Objective:  BP (!) 147/80   Pulse 77   Temp (!) 97 F (36.1 C) (Oral)   Ht 5' 7.5" (1.715 m)   Wt 290 lb (131.5 kg)   BMI 44.75 kg/m   BP Readings from Last 3 Encounters:  11/11/18 (!) 147/80  05/24/15 (!) 178/96  02/18/14 (!) 176/74    Wt Readings from Last 3 Encounters:  11/11/18 290 lb (131.5 kg)  05/24/15 (!) 351 lb 8 oz (159.4 kg)  02/18/14 (!) 343 lb 6 oz (155.8 kg)     Physical Exam Constitutional:      Appearance: He is well-developed. He is obese.  HENT:     Head: Normocephalic and atraumatic.  Eyes:     Pupils: Pupils are equal, round, and reactive to light.  Neck:     Musculoskeletal: Normal range of motion.     Thyroid: No thyromegaly.     Trachea: No tracheal deviation.  Cardiovascular:     Rate and Rhythm: Normal rate and regular rhythm.     Heart sounds: Normal heart sounds. No murmur. No friction rub. No gallop.   Pulmonary:     Breath sounds: Normal breath sounds. No wheezing or rales.  Abdominal:     General: Bowel sounds  are normal. There is no distension.     Palpations: Abdomen is soft. There is no mass.     Tenderness: There is no abdominal tenderness.  Musculoskeletal: Normal range of motion.  Lymphadenopathy:     Cervical: No cervical adenopathy.  Skin:    General: Skin is warm and dry.  Neurological:     Mental Status: He is alert and oriented to person, place, and time.       Assessment & Plan:   Raylon was seen today for establish care and hypertension.  Diagnoses and all orders for this visit:  Essential hypertension, benign -     CMP14+EGFR -     CBC with Differential/Platelet  Morbid obesity (HCC) -     Lipid panel -      CMP14+EGFR -     CBC with Differential/Platelet -     TSH  Other orders -     nabumetone (RELAFEN) 500 MG tablet; Take 2 tablets (1,000 mg total) by mouth 2 (two) times daily. For muscle and joint pain -     labetalol (NORMODYNE) 100 MG tablet; Take 1 tablet (100 mg total) by mouth 2 (two) times daily.       I have discontinued Deral Bartleson's amLODipine and lisinopril-hydrochlorothiazide. I am also having him start on nabumetone and labetalol.  Allergies as of 11/11/2018   No Known Allergies     Medication List       Accurate as of Nov 11, 2018  5:56 PM. If you have any questions, ask your nurse or doctor.        STOP taking these medications   amLODipine 5 MG tablet Commonly known as:  NORVASC Stopped by:  Claretta Fraise, MD   lisinopril-hydrochlorothiazide 20-12.5 MG tablet Commonly known as:  Zestoretic Stopped by:  Claretta Fraise, MD     TAKE these medications   labetalol 100 MG tablet Commonly known as:  NORMODYNE Take 1 tablet (100 mg total) by mouth 2 (two) times daily. Started by:  Claretta Fraise, MD   nabumetone 500 MG tablet Commonly known as:  RELAFEN Take 2 tablets (1,000 mg total) by mouth 2 (two) times daily. For muscle and joint pain Started by:  Claretta Fraise, MD        Follow-up: Return in about 1 month (around 12/12/2018).  Claretta Fraise, M.D.

## 2018-11-12 LAB — CBC WITH DIFFERENTIAL/PLATELET
Basophils Absolute: 0 10*3/uL (ref 0.0–0.2)
Basos: 1 %
EOS (ABSOLUTE): 0.1 10*3/uL (ref 0.0–0.4)
Eos: 2 %
Hematocrit: 38.4 % (ref 37.5–51.0)
Hemoglobin: 13.4 g/dL (ref 13.0–17.7)
Immature Grans (Abs): 0 10*3/uL (ref 0.0–0.1)
Immature Granulocytes: 0 %
Lymphocytes Absolute: 1.6 10*3/uL (ref 0.7–3.1)
Lymphs: 35 %
MCH: 29.8 pg (ref 26.6–33.0)
MCHC: 34.9 g/dL (ref 31.5–35.7)
MCV: 86 fL (ref 79–97)
Monocytes Absolute: 0.5 10*3/uL (ref 0.1–0.9)
Monocytes: 10 %
Neutrophils Absolute: 2.5 10*3/uL (ref 1.4–7.0)
Neutrophils: 52 %
Platelets: 274 10*3/uL (ref 150–450)
RBC: 4.49 x10E6/uL (ref 4.14–5.80)
RDW: 12.7 % (ref 11.6–15.4)
WBC: 4.7 10*3/uL (ref 3.4–10.8)

## 2018-11-12 LAB — CMP14+EGFR
ALT: 25 IU/L (ref 0–44)
AST: 27 IU/L (ref 0–40)
Albumin/Globulin Ratio: 1.7 (ref 1.2–2.2)
Albumin: 4.5 g/dL (ref 4.0–5.0)
Alkaline Phosphatase: 67 IU/L (ref 39–117)
BUN/Creatinine Ratio: 12 (ref 9–20)
BUN: 14 mg/dL (ref 6–24)
Bilirubin Total: 0.4 mg/dL (ref 0.0–1.2)
CO2: 21 mmol/L (ref 20–29)
Calcium: 9.2 mg/dL (ref 8.7–10.2)
Chloride: 109 mmol/L — ABNORMAL HIGH (ref 96–106)
Creatinine, Ser: 1.14 mg/dL (ref 0.76–1.27)
GFR calc Af Amer: 92 mL/min/{1.73_m2} (ref >59)
GFR calc non Af Amer: 80 mL/min/{1.73_m2} (ref >59)
Globulin, Total: 2.7 g/dL (ref 1.5–4.5)
Glucose: 76 mg/dL (ref 65–99)
Potassium: 4.2 mmol/L (ref 3.5–5.2)
Sodium: 142 mmol/L (ref 134–144)
Total Protein: 7.2 g/dL (ref 6.0–8.5)

## 2018-11-12 LAB — LIPID PANEL
Chol/HDL Ratio: 3.5 ratio (ref 0.0–5.0)
Cholesterol, Total: 135 mg/dL (ref 100–199)
HDL: 39 mg/dL — ABNORMAL LOW (ref >39)
LDL Calculated: 82 mg/dL (ref 0–99)
Triglycerides: 71 mg/dL (ref 0–149)
VLDL Cholesterol Cal: 14 mg/dL (ref 5–40)

## 2018-11-12 LAB — TSH: TSH: 2.22 u[IU]/mL (ref 0.450–4.500)

## 2018-12-13 ENCOUNTER — Ambulatory Visit: Payer: BLUE CROSS/BLUE SHIELD | Admitting: Family Medicine

## 2018-12-13 ENCOUNTER — Other Ambulatory Visit: Payer: Self-pay

## 2018-12-24 ENCOUNTER — Ambulatory Visit: Payer: BC Managed Care – PPO | Admitting: Family Medicine

## 2019-01-01 DIAGNOSIS — S9002XA Contusion of left ankle, initial encounter: Secondary | ICD-10-CM | POA: Diagnosis not present

## 2019-01-01 DIAGNOSIS — S99912A Unspecified injury of left ankle, initial encounter: Secondary | ICD-10-CM | POA: Diagnosis not present

## 2019-01-01 DIAGNOSIS — M25572 Pain in left ankle and joints of left foot: Secondary | ICD-10-CM | POA: Diagnosis not present

## 2019-01-01 DIAGNOSIS — I10 Essential (primary) hypertension: Secondary | ICD-10-CM | POA: Diagnosis not present

## 2019-01-01 DIAGNOSIS — Z8639 Personal history of other endocrine, nutritional and metabolic disease: Secondary | ICD-10-CM | POA: Diagnosis not present

## 2019-01-01 DIAGNOSIS — Z888 Allergy status to other drugs, medicaments and biological substances status: Secondary | ICD-10-CM | POA: Diagnosis not present

## 2019-01-01 DIAGNOSIS — F17200 Nicotine dependence, unspecified, uncomplicated: Secondary | ICD-10-CM | POA: Diagnosis not present

## 2019-01-01 DIAGNOSIS — Z8661 Personal history of infections of the central nervous system: Secondary | ICD-10-CM | POA: Diagnosis not present

## 2019-01-01 DIAGNOSIS — Z91030 Bee allergy status: Secondary | ICD-10-CM | POA: Diagnosis not present

## 2019-01-01 DIAGNOSIS — W228XXA Striking against or struck by other objects, initial encounter: Secondary | ICD-10-CM | POA: Diagnosis not present

## 2019-02-14 ENCOUNTER — Encounter: Payer: Self-pay | Admitting: Family Medicine

## 2019-02-14 ENCOUNTER — Ambulatory Visit (INDEPENDENT_AMBULATORY_CARE_PROVIDER_SITE_OTHER): Payer: BC Managed Care – PPO | Admitting: Family Medicine

## 2019-02-14 ENCOUNTER — Other Ambulatory Visit: Payer: Self-pay

## 2019-02-14 VITALS — BP 196/111 | HR 76 | Temp 97.3°F | Ht 68.0 in | Wt 290.0 lb

## 2019-02-14 DIAGNOSIS — I10 Essential (primary) hypertension: Secondary | ICD-10-CM | POA: Diagnosis not present

## 2019-02-14 DIAGNOSIS — R200 Anesthesia of skin: Secondary | ICD-10-CM

## 2019-02-14 DIAGNOSIS — R202 Paresthesia of skin: Secondary | ICD-10-CM

## 2019-02-14 MED ORDER — LABETALOL HCL 200 MG PO TABS: 200.0000 mg | ORAL_TABLET | Freq: Two times a day (BID) | ORAL | 2 refills | Status: DC

## 2019-02-14 MED ORDER — NABUMETONE 500 MG PO TABS: 1000.0000 mg | ORAL_TABLET | Freq: Two times a day (BID) | ORAL | 1 refills | Status: DC

## 2019-02-14 NOTE — Progress Notes (Signed)
Subjective:  Patient ID: Walter Stuart, male    DOB: 04-Sep-1975  Age: 43 y.o. MRN: [id number]  CC: Hypertension   HPI Walter Stuart presents for  follow-up of hypertension. Patient has no history of headache chest pain or shortness of breath or recent cough. Patient also denies symptoms of TIA such as focal numbness or weakness. Patient denies side effects from medication. States taking it regularly.  Numbness and ache in right wrist radiating up the arm. Onset several weeks ago. Getting worse.  History Walter Stuart has a past medical history of Gout (2011), Hypertension (02-2015), and Meningitis.   Walter Stuart has a past surgical history that includes Shoulder arthroscopy with subacromial decompression (Left, 02/18/2014) and Biceps tendon repair (Left).   His family history is not on file.Walter Stuart reports that Walter Stuart has been smoking cigarettes. Walter Stuart has a 4.50 pack-year smoking history. Walter Stuart has never used smokeless tobacco. Walter Stuart reports current alcohol use. Walter Stuart reports current drug use. Drug: Marijuana.  No current outpatient medications on file prior to visit.   No current facility-administered medications on file prior to visit.     ROS Review of Systems  Constitutional: Negative for fever.  Respiratory: Negative for shortness of breath.   Cardiovascular: Negative for chest pain.  Musculoskeletal: Positive for arthralgias (left elbow, tailbone get sharp twinges).  Skin: Negative for rash.    Objective:  BP (!) 196/111   Pulse 76   Temp (!) 97.3 F (36.3 C) (Temporal)   Ht 5\' 8"  (1.727 m)   Wt 290 lb (131.5 kg)   BMI 44.09 kg/m   BP Readings from Last 3 Encounters:  02/14/19 (!) 196/111  11/11/18 (!) 147/80  05/24/15 (!) 178/96    Wt Readings from Last 3 Encounters:  02/14/19 290 lb (131.5 kg)  11/11/18 290 lb (131.5 kg)  05/24/15 (!) 351 lb 8 oz (159.4 kg)     Physical Exam Vitals signs reviewed.  Constitutional:      Appearance: Walter Stuart is well-developed.  HENT:     Head:  Normocephalic and atraumatic.     Right Ear: External ear normal.     Left Ear: External ear normal.     Mouth/Throat:     Pharynx: No oropharyngeal exudate or posterior oropharyngeal erythema.  Eyes:     Pupils: Pupils are equal, round, and reactive to light.  Neck:     Musculoskeletal: Normal range of motion and neck supple.  Cardiovascular:     Rate and Rhythm: Normal rate and regular rhythm.     Heart sounds: No murmur.  Pulmonary:     Effort: No respiratory distress.     Breath sounds: Normal breath sounds.  Musculoskeletal:        General: Tenderness (right wrist) present.  Neurological:     Mental Status: Walter Stuart is alert and oriented to person, place, and time.       Assessment & Plan:   Walter Stuart was seen today for hypertension.  Diagnoses and all orders for this visit:  Essential hypertension, benign  Numbness and tingling in right hand -     Nerve conduction test; Future  Other orders -     nabumetone (RELAFEN) 500 MG tablet; Take 2 tablets (1,000 mg total) by mouth 2 (two) times daily. For muscle and joint pain -     labetalol (NORMODYNE) 200 MG tablet; Take 1 tablet (200 mg total) by mouth 2 (two) times daily.   Allergies as of 02/14/2019   No Known Allergies     Medication  List       Accurate as of February 14, 2019 11:59 PM. If you have any questions, ask your nurse or doctor.        labetalol 200 MG tablet Commonly known as: NORMODYNE Take 1 tablet (200 mg total) by mouth 2 (two) times daily. What changed:   medication strength  how much to take Changed by: Mechele ClaudeWarren Orie Baxendale, MD   nabumetone 500 MG tablet Commonly known as: RELAFEN Take 2 tablets (1,000 mg total) by mouth 2 (two) times daily. For muscle and joint pain       Meds ordered this encounter  Medications  . nabumetone (RELAFEN) 500 MG tablet    Sig: Take 2 tablets (1,000 mg total) by mouth 2 (two) times daily. For muscle and joint pain    Dispense:  360 tablet    Refill:  1  .  labetalol (NORMODYNE) 200 MG tablet    Sig: Take 1 tablet (200 mg total) by mouth 2 (two) times daily.    Dispense:  60 tablet    Refill:  2      Follow-up: Return in about 1 month (around 03/17/2019).  Mechele ClaudeWarren Ryden Wainer, M.D.

## 2019-02-14 NOTE — Patient Instructions (Signed)
Return in one month.  

## 2019-02-24 DIAGNOSIS — F17200 Nicotine dependence, unspecified, uncomplicated: Secondary | ICD-10-CM | POA: Diagnosis not present

## 2019-02-24 DIAGNOSIS — M109 Gout, unspecified: Secondary | ICD-10-CM | POA: Diagnosis not present

## 2019-02-24 DIAGNOSIS — I10 Essential (primary) hypertension: Secondary | ICD-10-CM | POA: Diagnosis not present

## 2019-02-24 DIAGNOSIS — Z720 Tobacco use: Secondary | ICD-10-CM | POA: Diagnosis not present

## 2019-02-26 ENCOUNTER — Emergency Department (HOSPITAL_COMMUNITY): Payer: BC Managed Care – PPO

## 2019-02-26 ENCOUNTER — Other Ambulatory Visit: Payer: Self-pay

## 2019-02-26 ENCOUNTER — Encounter (HOSPITAL_COMMUNITY): Payer: Self-pay | Admitting: Emergency Medicine

## 2019-02-26 ENCOUNTER — Emergency Department (HOSPITAL_COMMUNITY)
Admission: EM | Admit: 2019-02-26 | Discharge: 2019-02-26 | Disposition: A | Discharge status: Home / Self Care | Payer: BC Managed Care – PPO | Attending: Emergency Medicine | Admitting: Emergency Medicine

## 2019-02-26 DIAGNOSIS — Z79899 Other long term (current) drug therapy: Secondary | ICD-10-CM | POA: Diagnosis not present

## 2019-02-26 DIAGNOSIS — F1210 Cannabis abuse, uncomplicated: Secondary | ICD-10-CM | POA: Insufficient documentation

## 2019-02-26 DIAGNOSIS — R51 Headache: Secondary | ICD-10-CM | POA: Insufficient documentation

## 2019-02-26 DIAGNOSIS — I160 Hypertensive urgency: Secondary | ICD-10-CM | POA: Insufficient documentation

## 2019-02-26 DIAGNOSIS — I10 Essential (primary) hypertension: Secondary | ICD-10-CM | POA: Diagnosis not present

## 2019-02-26 DIAGNOSIS — F17210 Nicotine dependence, cigarettes, uncomplicated: Secondary | ICD-10-CM | POA: Insufficient documentation

## 2019-02-26 DIAGNOSIS — M542 Cervicalgia: Secondary | ICD-10-CM | POA: Diagnosis not present

## 2019-02-26 LAB — CBC WITH DIFFERENTIAL/PLATELET
Abs Immature Granulocytes: 0.02 10*3/uL (ref 0.00–0.07)
Basophils Absolute: 0 10*3/uL (ref 0.0–0.1)
Basophils Relative: 1 %
Eosinophils Absolute: 0.1 10*3/uL (ref 0.0–0.5)
Eosinophils Relative: 2 %
HCT: 39.4 % (ref 39.0–52.0)
Hemoglobin: 12.8 g/dL — ABNORMAL LOW (ref 13.0–17.0)
Immature Granulocytes: 0 %
Lymphocytes Relative: 28 %
Lymphs Abs: 1.7 10*3/uL (ref 0.7–4.0)
MCH: 29.4 pg (ref 26.0–34.0)
MCHC: 32.5 g/dL (ref 30.0–36.0)
MCV: 90.4 fL (ref 80.0–100.0)
Monocytes Absolute: 0.6 10*3/uL (ref 0.1–1.0)
Monocytes Relative: 9 %
Neutro Abs: 3.8 10*3/uL (ref 1.7–7.7)
Neutrophils Relative %: 60 %
Platelets: 232 10*3/uL (ref 150–400)
RBC: 4.36 MIL/uL (ref 4.22–5.81)
RDW: 11.9 % (ref 11.5–15.5)
WBC: 6.2 10*3/uL (ref 4.0–10.5)
nRBC: 0 % (ref 0.0–0.2)

## 2019-02-26 LAB — BASIC METABOLIC PANEL
Anion gap: 7 (ref 5–15)
BUN: 18 mg/dL (ref 6–20)
CO2: 26 mmol/L (ref 22–32)
Calcium: 8.8 mg/dL — ABNORMAL LOW (ref 8.9–10.3)
Chloride: 106 mmol/L (ref 98–111)
Creatinine, Ser: 1.18 mg/dL (ref 0.61–1.24)
GFR calc Af Amer: >60 mL/min (ref >60)
GFR calc non Af Amer: >60 mL/min (ref >60)
Glucose, Bld: 91 mg/dL (ref 70–99)
Potassium: 3.3 mmol/L — ABNORMAL LOW (ref 3.5–5.1)
Sodium: 139 mmol/L (ref 135–145)

## 2019-02-26 LAB — TROPONIN I (HIGH SENSITIVITY)
Troponin I (High Sensitivity): 5 ng/L (ref <18)
Troponin I (High Sensitivity): 6 ng/L (ref <18)

## 2019-02-26 MED ORDER — IOHEXOL 350 MG/ML SOLN
100.0000 mL | Freq: Once | INTRAVENOUS | Status: AC | PRN
  Administered 2019-02-26: 100 mL via INTRAVENOUS

## 2019-02-26 MED ORDER — METOCLOPRAMIDE HCL 5 MG/ML IJ SOLN
10.0000 mg | Freq: Once | INTRAMUSCULAR | Status: AC
  Administered 2019-02-26: 10 mg via INTRAVENOUS
  Filled 2019-02-26: qty 2

## 2019-02-26 MED ORDER — HYDRALAZINE HCL 20 MG/ML IJ SOLN
10.0000 mg | Freq: Once | INTRAMUSCULAR | Status: AC
  Administered 2019-02-26: 01:00:00 10 mg via INTRAVENOUS
  Filled 2019-02-26: qty 1

## 2019-02-26 MED ORDER — KETOROLAC TROMETHAMINE 30 MG/ML IJ SOLN
30.0000 mg | Freq: Once | INTRAMUSCULAR | Status: AC
  Administered 2019-02-26: 30 mg via INTRAVENOUS
  Filled 2019-02-26: qty 1

## 2019-02-26 MED ORDER — DIPHENHYDRAMINE HCL 50 MG/ML IJ SOLN
25.0000 mg | Freq: Once | INTRAMUSCULAR | Status: AC
  Administered 2019-02-26: 25 mg via INTRAVENOUS
  Filled 2019-02-26: qty 1

## 2019-02-26 NOTE — Discharge Instructions (Signed)
Take your blood pressure medication as prescribed and keep a record of it.  Take it at the same time every day and show your doctor your blood pressure readings for further medication adjustments.  Return to the ED with chest pain, shortness of breath, unilateral weakness, numbness, difficulty speaking, difficulty swallowing or other concerns.

## 2019-02-26 NOTE — ED Triage Notes (Signed)
Pt reports hx of Htn, reports he was taking 100mg  of Labetalol twice daily, last Thursday PCP increased dosage to 200mg  2x daily, pt reports he was seen yesterday at [facility] for [place] (200/100), pt reports he was sent to ER from work tonight for high BP (206/126), triage BP 191/98

## 2019-02-26 NOTE — ED Provider Notes (Addendum)
[facility] EMERGENCY DEPARTMENT Provider Note   CSN: [id number] Arrival date & time: 02/26/19  0031     History   Chief Complaint Chief Complaint  Patient presents with   Hypertension    HPI Walter Stuart is a 43 y.o. male.     Patient with history of hypertension presenting from work with elevated blood pressure.  States he has a history of hypertension and takes labetalol 100 mg twice daily.  His dose was increased to 200 mg twice daily by his PCP last week but he just started the new prescription yesterday. This was after he was seen in the ED last night with elevated blood pressure.  Patient states he was seen at an outside hospital yesterday for pain in the left side of his neck that has been intermittent for several days and was told his blood pressure was elevated again at 200/100.  He states nothing was done and he went to work tonight and developed a gradual onset headache around 6 PM has become progressively worse associated with right-sided posterior neck pain.  His blood pressure at work tonight was 206/126.  He was told to come to the hospital.  There is no focal weakness, numbness or tingling.  No difficulty speaking or difficulty swallowing.  No chest pain or shortness of breath.  No abdominal pain, nausea or vomiting. States headache is gradual in onset not sudden onset. He gets similar headaches when he hasn't eaten like today.  The neck pain he had on the left side of his neck last night is no longer present.  He does have pain in his right posterior neck however which is new.  The history is provided by the patient.  Hypertension Associated symptoms include headaches. Pertinent negatives include no chest pain, no abdominal pain and no shortness of breath.    Past Medical History:  Diagnosis Date   Gout 2011   Hypertension 02-2015   Meningitis    past hx. ? seizure"passed out"    Patient Active Problem List   Diagnosis Date Noted   Essential hypertension,  benign 05/24/2015   Morbid obesity (HCC) 05/24/2015    Past Surgical History:  Procedure Laterality Date   BICEPS TENDON REPAIR Left    SHOULDER ARTHROSCOPY WITH SUBACROMIAL DECOMPRESSION Left 02/18/2014   Procedure: LEFT SHOULDER ARTHROSCOPY WITH SUBACROMIAL DECOMPRESSION, DEBRIDEMENT OF LABRAL TEAR AND BICEPS TENDON;  Surgeon: Javier Docker, MD;  Location: [facility];  Service: Orthopedics;  Laterality: Left;        Home Medications    Prior to Admission medications   Medication Sig Start Date End Date Taking? Authorizing Provider  labetalol (NORMODYNE) 200 MG tablet Take 1 tablet (200 mg total) by mouth 2 (two) times daily. 02/14/19   Mechele Claude, MD  nabumetone (RELAFEN) 500 MG tablet Take 2 tablets (1,000 mg total) by mouth 2 (two) times daily. For muscle and joint pain 02/14/19   Mechele Claude, MD    Family History History reviewed. No pertinent family history.  Social History Social History   Tobacco Use   Smoking status: Current Every Day Smoker    Packs/day: 0.25    Years: 18.00    Pack years: 4.50    Types: Cigarettes   Smokeless tobacco: Never Used  Substance Use Topics   Alcohol use: Yes    Comment: occ. social   Drug use: Yes    Types: Marijuana     Allergies   Lisinopril   Review of Systems Review of Systems  Constitutional: Negative for activity change, appetite change, fatigue and fever.  HENT: Negative for congestion and rhinorrhea.   Eyes: Negative for visual disturbance.  Respiratory: Negative for cough and shortness of breath.   Cardiovascular: Negative for chest pain.  Gastrointestinal: Negative for abdominal pain, nausea and vomiting.  Genitourinary: Negative for dysuria and hematuria.  Musculoskeletal: Positive for neck pain. Negative for arthralgias and myalgias.  Skin: Negative for rash.  Neurological: Positive for headaches. Negative for dizziness, weakness, light-headedness and numbness.  Hematological: Negative for  adenopathy.    all other systems are negative except as noted in the HPI and PMH.    Physical Exam Updated Vital Signs BP (!) 191/98    Pulse 80    Temp 98.1 F (36.7 C) (Oral)    Resp 18    Ht 5\' 7"  (1.702 m)    Wt 124.7 kg    SpO2 98%    BMI 43.07 kg/m   Physical Exam Vitals signs and nursing note reviewed.  Constitutional:      General: He is not in acute distress.    Appearance: He is well-developed. He is obese.  HENT:     Head: Normocephalic and atraumatic.     Nose: Nose normal. No rhinorrhea.     Mouth/Throat:     Mouth: Mucous membranes are moist.     Pharynx: No oropharyngeal exudate.  Eyes:     Conjunctiva/sclera: Conjunctivae normal.     Pupils: Pupils are equal, round, and reactive to light.  Neck:     Musculoskeletal: Normal range of motion and neck supple.     Comments: Right paraspinal cervical tenderness, no midline tenderness Cardiovascular:     Rate and Rhythm: Normal rate and regular rhythm.     Heart sounds: Normal heart sounds. No murmur.  Pulmonary:     Effort: Pulmonary effort is normal. No respiratory distress.     Breath sounds: Normal breath sounds.  Abdominal:     Palpations: Abdomen is soft.     Tenderness: There is no abdominal tenderness. There is no guarding or rebound.  Musculoskeletal: Normal range of motion.        General: No tenderness.  Skin:    General: Skin is warm.     Capillary Refill: Capillary refill takes less than 2 seconds.  Neurological:     General: No focal deficit present.     Mental Status: He is alert and oriented to person, place, and time. Mental status is at baseline.     Cranial Nerves: No cranial nerve deficit.     Motor: No abnormal muscle tone.     Coordination: Coordination normal.     Comments: No ataxia on finger to nose bilaterally. No pronator drift. 5/5 strength throughout. CN 2-12 intact.Equal grip strength. Sensation intact.   Psychiatric:        Behavior: Behavior normal.      ED Treatments /  Results  Labs (all labs ordered are listed, but only abnormal results are displayed) Labs Reviewed  CBC WITH DIFFERENTIAL/PLATELET - Abnormal; Notable for the following components:      Result Value   Hemoglobin 12.8 (*)    All other components within normal limits  BASIC METABOLIC PANEL - Abnormal; Notable for the following components:   Potassium 3.3 (*)    Calcium 8.8 (*)    All other components within normal limits  TROPONIN I (HIGH SENSITIVITY)  TROPONIN I (HIGH SENSITIVITY)    EKG EKG Interpretation  Date/Time:  Wednesday February 26 2019 01:32:32 EDT Ventricular Rate:  68 PR Interval:    QRS Duration: 98 QT Interval:  386 QTC Calculation: 411 R Axis:   -21 Text Interpretation:  Sinus rhythm Borderline left axis deviation Abnormal R-wave progression, early transition Minimal ST depression, inferior leads No previous ECGs available Confirmed by Glynn Octaveancour, Yuriana Gaal [phone]([id number]) on 02/26/2019 2:14:01 AM   Radiology Ct Angio Head W Or Wo Contrast  Result Date: 02/26/2019 CLINICAL DATA:  Initial evaluation for acute posterior headache and neck pain. EXAM: CT ANGIOGRAPHY HEAD AND NECK TECHNIQUE: Multidetector CT imaging of the head and neck was performed using the standard protocol during bolus administration of intravenous contrast. Multiplanar CT image reconstructions and MIPs were obtained to evaluate the vascular anatomy. Carotid stenosis measurements (when applicable) are obtained utilizing NASCET criteria, using the distal internal carotid diameter as the denominator. CONTRAST:  100mL OMNIPAQUE IOHEXOL 350 MG/ML SOLN COMPARISON:  None. FINDINGS: CT HEAD FINDINGS Brain: Cerebral volume within normal limits for patient age. No evidence for acute intracranial hemorrhage. There is a small 8 mm focus of encephalomalacia involving the high posterior right parietal cortex (series 4, image 22), consistent with a small chronic infarct. No other evidence for acute large vessel territory infarct. No  mass lesion, midline shift or mass effect. No hydrocephalus. No extra-axial fluid collection. Vascular: No hyperdense vessel. Skull: Scalp soft tissues demonstrate no acute abnormality. Calvarium intact. Sinuses/Orbits: Globes and orbital soft tissues within normal limits. Visualized paranasal sinuses are clear. No mastoid effusion. CTA NECK FINDINGS Aortic arch: Visualized aortic arch of normal caliber with normal 3 vessel morphology. Minimal atheromatous plaque noted within the arch itself. No flow-limiting stenosis about the origin of the great vessels. Visualized subclavian arteries widely patent. Right carotid system: Evaluation of the proximal right CCA limited by body habitus and streak artifact from adjacent venous contamination. Visualized right CCA patent to the bifurcation without stenosis. No atheromatous narrowing about the right bifurcation. Right ICA widely patent to the skull base without stenosis, dissection or occlusion. Left carotid system: Evaluation of the proximal left CCA mildly limited by body habitus. Visualized left CCA patent to the bifurcation without stenosis. No atheromatous narrowing about the left bifurcation. Left ICA widely patent distally to the skull base without stenosis, dissection, or occlusion. Vertebral arteries: Both vertebral arteries arise from the subclavian arteries. Evaluation of the proximal vertebral arteries limited by body habitus and adjacent venous contamination. Visualized vertebral arteries widely patent without stenosis, dissection or occlusion. Skeleton: No acute osseous abnormality. No discrete lytic or blastic osseous lesions. Other neck: No other acute soft tissue abnormality within the neck. Upper chest: Visualized upper chest demonstrates no acute finding. Review of the MIP images confirms the above findings CTA HEAD FINDINGS Anterior circulation: Internal carotid arteries widely patent the termini without stenosis or other abnormality. A1 segments,  anterior communicating artery common anterior cerebral arteries well opacified and normal in appearance. No M1 stenosis or occlusion. Normal MCA bifurcations. Distal MCA branches well perfused and symmetric. Posterior circulation: Vertebral arteries widely patent to the vertebrobasilar junction without stenosis. Posteroinferior cerebral arteries not visualized. Basilar widely patent to its distal aspect. Superior cerebellar and posterior cerebral arteries widely patent bilaterally. Venous sinuses: Grossly patent allowing for timing the contrast bolus. Anatomic variants: None significant. Review of the MIP images confirms the above findings IMPRESSION: CT HEAD IMPRESSION: 1. No acute intracranial abnormality. 2. Small focus of encephalomalacia involving the high posterior right parietal cortex, consistent with a small chronic infarct. 3. Otherwise unremarkable and normal head  CT. CTA HEAD AND NECK IMPRESSION: Normal CTA of the head and neck. No large vessel occlusion, hemodynamically significant stenosis, or other acute vascular abnormality. Electronically Signed   By: Rise Mu M.D.   On: 02/26/2019 03:47   Ct Angio Neck W And/or Wo Contrast  Result Date: 02/26/2019 CLINICAL DATA:  Initial evaluation for acute posterior headache and neck pain. EXAM: CT ANGIOGRAPHY HEAD AND NECK TECHNIQUE: Multidetector CT imaging of the head and neck was performed using the standard protocol during bolus administration of intravenous contrast. Multiplanar CT image reconstructions and MIPs were obtained to evaluate the vascular anatomy. Carotid stenosis measurements (when applicable) are obtained utilizing NASCET criteria, using the distal internal carotid diameter as the denominator. CONTRAST:  OMNIPAQUE IOHEXOL 350 MG/ML SOLN COMPARISON:  None. FINDINGS: CT HEAD FINDINGS Brain: Cerebral volume within normal limits for patient age. No evidence for acute intracranial hemorrhage. There is a small 8 mm focus of  encephalomalacia involving the high posterior right parietal cortex (series 4, image 22), consistent with a small chronic infarct. No other evidence for acute large vessel territory infarct. No mass lesion, midline shift or mass effect. No hydrocephalus. No extra-axial fluid collection. Vascular: No hyperdense vessel. Skull: Scalp soft tissues demonstrate no acute abnormality. Calvarium intact. Sinuses/Orbits: Globes and orbital soft tissues within normal limits. Visualized paranasal sinuses are clear. No mastoid effusion. CTA NECK FINDINGS Aortic arch: Visualized aortic arch of normal caliber with normal 3 vessel morphology. Minimal atheromatous plaque noted within the arch itself. No flow-limiting stenosis about the origin of the great vessels. Visualized subclavian arteries widely patent. Right carotid system: Evaluation of the proximal right CCA limited by body habitus and streak artifact from adjacent venous contamination. Visualized right CCA patent to the bifurcation without stenosis. No atheromatous narrowing about the right bifurcation. Right ICA widely patent to the skull base without stenosis, dissection or occlusion. Left carotid system: Evaluation of the proximal left CCA mildly limited by body habitus. Visualized left CCA patent to the bifurcation without stenosis. No atheromatous narrowing about the left bifurcation. Left ICA widely patent distally to the skull base without stenosis, dissection, or occlusion. Vertebral arteries: Both vertebral arteries arise from the subclavian arteries. Evaluation of the proximal vertebral arteries limited by body habitus and adjacent venous contamination. Visualized vertebral arteries widely patent without stenosis, dissection or occlusion. Skeleton: No acute osseous abnormality. No discrete lytic or blastic osseous lesions. Other neck: No other acute soft tissue abnormality within the neck. Upper chest: Visualized upper chest demonstrates no acute finding. Review  of the MIP images confirms the above findings CTA HEAD FINDINGS Anterior circulation: Internal carotid arteries widely patent the termini without stenosis or other abnormality. A1 segments, anterior communicating artery common anterior cerebral arteries well opacified and normal in appearance. No M1 stenosis or occlusion. Normal MCA bifurcations. Distal MCA branches well perfused and symmetric. Posterior circulation: Vertebral arteries widely patent to the vertebrobasilar junction without stenosis. Posteroinferior cerebral arteries not visualized. Basilar widely patent to its distal aspect. Superior cerebellar and posterior cerebral arteries widely patent bilaterally. Venous sinuses: Grossly patent allowing for timing the contrast bolus. Anatomic variants: None significant. Review of the MIP images confirms the above findings IMPRESSION: CT HEAD IMPRESSION: 1. No acute intracranial abnormality. 2. Small focus of encephalomalacia involving the high posterior right parietal cortex, consistent with a small chronic infarct. 3. Otherwise unremarkable and normal head CT. CTA HEAD AND NECK IMPRESSION: Normal CTA of the head and neck. No large vessel occlusion, hemodynamically significant stenosis, or other  acute vascular abnormality. Electronically Signed   By: Rise MuBenjamin  McClintock M.D.   On: 02/26/2019 03:47    Procedures Procedures (including critical care time)  Medications Ordered in ED Medications  hydrALAZINE (APRESOLINE) injection 10 mg (has no administration in time range)     Initial Impression / Assessment and Plan / ED Course  I have reviewed the triage vital signs and the nursing notes.  Pertinent labs & imaging results that were available during my care of the patient were reviewed by me and considered in my medical decision making (see chart for details).       Elevated blood pressure with headache and right-sided neck pain.  Recently increased his labetalol dose yesterday.  No chest pain  or shortness of breath.  Headache is gradual in onset without thunderclap etiology.  EKG is sinus rhythm.  Labs are reassuring without evidence of endorgan damage.  He is given IV hydralazine.  Patient with persistent headache as well as right-sided neck pain in setting of elevated blood pressure.  Imaging was performed to rule out carotid or vertebral artery dissection. Low suspicion for subarachnoid hemorrhage.  Headache is gradual in onset without thunderclap.  CTA is reassuring without evidence of aneurysm, dissection or other acute pathology. Patient informed of area of encephalomalacia concerning for chronic infarct.  Headache is improved with Toradol, Reglan and Benadryl.  Neurological exam is nonfocal.  Troponin negative x2.  Low suspicion for ACS, PE, or aortic dissection.  Blood pressure improved to 165/96.  Advised to keep a record of his blood pressure and follow-up with his PCP for further medication adjustments.  Return precautions discussed.  BP (!) 165/96    Pulse 72    Temp 98.1 F (36.7 C) (Oral)    Resp (!) 21    Ht 5\' 7"  (1.702 m)    Wt 124.7 kg    SpO2 98%    BMI 43.07 kg/m   CRITICAL CARE Performed by: Glynn OctaveStephen Taison Celani Total critical care time: 32 minutes Critical care time was exclusive of separately billable procedures and treating other patients. Critical care was necessary to treat or prevent imminent or life-threatening deterioration. Critical care was time spent personally by me on the following activities: development of treatment plan with patient and/or surrogate as well as nursing, discussions with consultants, evaluation of patient's response to treatment, examination of patient, obtaining history from patient or surrogate, ordering and performing treatments and interventions, ordering and review of laboratory studies, ordering and review of radiographic studies, pulse oximetry and re-evaluation of patient's condition.   Final Clinical Impressions(s) / ED  Diagnoses   Final diagnoses:  Hypertensive urgency    ED Discharge Orders    None       Gardy Montanari, Jeannett SeniorStephen, MD 02/26/19 [id number]    Glynn Octaveancour, Mileydi Milsap, MD 02/26/19 [phone]

## 2019-02-28 ENCOUNTER — Telehealth: Payer: Self-pay | Admitting: *Deleted

## 2019-02-28 ENCOUNTER — Telehealth: Payer: Self-pay | Admitting: Family Medicine

## 2019-02-28 NOTE — Telephone Encounter (Signed)
I would continue medications and keep follow up appointment

## 2019-02-28 NOTE — Telephone Encounter (Signed)
NA - no VM  

## 2019-02-28 NOTE — Telephone Encounter (Signed)
Per pr Labetolol causing extreme headache Please review and advise Pt has appt with [facility] on Tuesday

## 2019-02-28 NOTE — Telephone Encounter (Signed)
Patient's headache began Tuesday evening.  He reports no headache today, has not had any other symptoms, no weakness, confusion, fatigue, dizziness, ears ringing.  Does not have means to check blood pressure at home, does plan to purchase a monitor.

## 2019-02-28 NOTE — Telephone Encounter (Signed)
He has been on labetalol. When did the headaches start? What is his current BP? Any other symptoms?

## 2019-03-04 ENCOUNTER — Ambulatory Visit: Payer: BC Managed Care – PPO | Admitting: Family Medicine

## 2019-03-05 NOTE — Telephone Encounter (Signed)
Multiple attempts made to contact patient.  This encounter will now be closed  

## 2019-03-11 ENCOUNTER — Encounter: Payer: Self-pay | Admitting: Family Medicine

## 2019-03-18 ENCOUNTER — Ambulatory Visit: Payer: BC Managed Care – PPO | Admitting: Family Medicine

## 2019-03-18 ENCOUNTER — Encounter: Payer: Self-pay | Admitting: Family Medicine

## 2019-03-18 ENCOUNTER — Other Ambulatory Visit: Payer: Self-pay

## 2019-03-18 VITALS — BP 193/100 | HR 72 | Temp 98.4°F | Resp 20 | Ht 67.0 in | Wt 289.0 lb

## 2019-03-18 DIAGNOSIS — I10 Essential (primary) hypertension: Secondary | ICD-10-CM | POA: Diagnosis not present

## 2019-03-18 DIAGNOSIS — L84 Corns and callosities: Secondary | ICD-10-CM

## 2019-03-18 MED ORDER — AMLODIPINE BESYLATE 5 MG PO TABS: 5.0000 mg | ORAL_TABLET | Freq: Every day | ORAL | 5 refills | Status: DC

## 2019-03-18 NOTE — Progress Notes (Signed)
Subjective:  Patient ID: Walter Stuart, male    DOB: January 26, 1976  Age: 43 y.o. MRN: [id number]  CC: Hypertension (1 mo follow up) and right foot pain   HPI Walter Stuart presents for  follow-up of hypertension. Patient has no history of headache chest pain or shortness of breath or recent cough. Patient also denies symptoms of TIA such as focal numbness or weakness. Patient denies side effects from medication. States taking it regularly.   History Walter Stuart has a past medical history of Gout (2011), Hypertension (02-2015), and Meningitis.   Walter Stuart has a past surgical history that includes Shoulder arthroscopy with subacromial decompression (Left, 02/18/2014) and Biceps tendon repair (Left).   His family history is not on file.Walter Stuart reports that Walter Stuart has been smoking cigarettes. Walter Stuart has a 4.50 pack-year smoking history. Walter Stuart has never used smokeless tobacco. Walter Stuart reports current alcohol use. Walter Stuart reports current drug use. Drug: Marijuana.  Current Outpatient Medications on File Prior to Visit  Medication Sig Dispense Refill  . labetalol (NORMODYNE) 200 MG tablet Take 1 tablet (200 mg total) by mouth 2 (two) times daily. 60 tablet 2   No current facility-administered medications on file prior to visit.     ROS Review of Systems  Constitutional: Negative for fever.  Respiratory: Negative for shortness of breath.   Cardiovascular: Negative for chest pain.  Musculoskeletal: Negative for arthralgias.  Skin: Negative for rash.       Right foot has several painful calluses.       Objective:  BP (!) 193/100 (BP Location: Right Arm, Cuff Size: Large)   Pulse 72   Temp 98.4 F (36.9 C)   Resp 20   Ht 5\' 7"  (1.702 m)   Wt 289 lb (131.1 kg)   SpO2 97%   BMI 45.26 kg/m   BP Readings from Last 3 Encounters:  03/18/19 (!) 193/100  02/26/19 (!) 165/96  02/14/19 (!) 196/111    Wt Readings from Last 3 Encounters:  03/18/19 289 lb (131.1 kg)  02/26/19 275 lb (124.7 kg)  02/14/19 290 lb (131.5  kg)     Physical Exam Vitals signs reviewed.  Constitutional:      Appearance: Walter Stuart is well-developed.  HENT:     Head: Normocephalic and atraumatic.     Right Ear: External ear normal.     Left Ear: External ear normal.     Mouth/Throat:     Pharynx: No oropharyngeal exudate or posterior oropharyngeal erythema.  Eyes:     Pupils: Pupils are equal, round, and reactive to light.  Neck:     Musculoskeletal: Normal range of motion and neck supple.  Cardiovascular:     Rate and Rhythm: Normal rate and regular rhythm.     Heart sounds: No murmur.  Pulmonary:     Effort: No respiratory distress.     Breath sounds: Normal breath sounds.  Skin:    Findings: Lesion (three cm callus at bases of right 4th toe at ball, plantar. 1 cm between R 1st ans 2nd toe. Another medially on first toe) present.  Neurological:     Mental Status: Walter Stuart is alert and oriented to person, place, and time.       Assessment & Plan:   Walter Stuart was seen today for hypertension and right foot pain.  Diagnoses and all orders for this visit:  Accelerated hypertension  Callus of foot -     Ambulatory referral to Podiatry  Morbid obesity ([place])  Other orders -  amLODipine (NORVASC) 5 MG tablet; Take 1 tablet (5 mg total) by mouth daily. For blood pressure   Allergies as of 03/18/2019      Reactions   Lisinopril Swelling      Medication List       Accurate as of March 18, 2019  5:56 PM. If you have any questions, ask your nurse or doctor.        STOP taking these medications   nabumetone 500 MG tablet Commonly known as: RELAFEN Stopped by: Mechele Claude, MD     TAKE these medications   amLODipine 5 MG tablet Commonly known as: NORVASC Take 1 tablet (5 mg total) by mouth daily. For blood pressure Started by: Mechele Claude, MD   labetalol 200 MG tablet Commonly known as: NORMODYNE Take 1 tablet (200 mg total) by mouth 2 (two) times daily.       Meds ordered this encounter   Medications  . amLODipine (NORVASC) 5 MG tablet    Sig: Take 1 tablet (5 mg total) by mouth daily. For blood pressure    Dispense:  30 tablet    Refill:  5      Follow-up: Return in about 6 weeks (around 04/29/2019).  Mechele Claude, M.D.

## 2019-03-19 ENCOUNTER — Telehealth: Payer: Self-pay | Admitting: Family Medicine

## 2019-03-23 ENCOUNTER — Other Ambulatory Visit: Payer: Self-pay

## 2019-03-23 ENCOUNTER — Encounter (HOSPITAL_COMMUNITY): Payer: Self-pay

## 2019-03-23 ENCOUNTER — Emergency Department (HOSPITAL_COMMUNITY)
Admission: EM | Admit: 2019-03-23 | Discharge: 2019-03-23 | Disposition: A | Discharge status: Home / Self Care | Payer: BC Managed Care – PPO | Attending: Emergency Medicine | Admitting: Emergency Medicine

## 2019-03-23 DIAGNOSIS — Z79899 Other long term (current) drug therapy: Secondary | ICD-10-CM | POA: Insufficient documentation

## 2019-03-23 DIAGNOSIS — Z888 Allergy status to other drugs, medicaments and biological substances status: Secondary | ICD-10-CM | POA: Insufficient documentation

## 2019-03-23 DIAGNOSIS — R1110 Vomiting, unspecified: Secondary | ICD-10-CM | POA: Diagnosis not present

## 2019-03-23 DIAGNOSIS — Z20822 Contact with and (suspected) exposure to covid-19: Secondary | ICD-10-CM

## 2019-03-23 DIAGNOSIS — U071 COVID-19: Secondary | ICD-10-CM | POA: Diagnosis not present

## 2019-03-23 DIAGNOSIS — Z20828 Contact with and (suspected) exposure to other viral communicable diseases: Secondary | ICD-10-CM

## 2019-03-23 DIAGNOSIS — F17210 Nicotine dependence, cigarettes, uncomplicated: Secondary | ICD-10-CM | POA: Diagnosis not present

## 2019-03-23 DIAGNOSIS — I10 Essential (primary) hypertension: Secondary | ICD-10-CM | POA: Diagnosis not present

## 2019-03-23 NOTE — Discharge Instructions (Addendum)
Your blood pressure is elevated.  Please have this rechecked soon.  A COVID-19 test has been sent to the lab.  You should probably receive results within the next 48 to 72hours.  Please quarantine yourself until these results return.  Wash hands frequently.  Use your mask.  Maintain proper distancing.  Monitor your temperature closely.  Use Tylenol every 4 hours or ibuprofen every 6 hours if needed for fever, and/or aching.  Please return to the emergency department if any difficulty with breathing, difficulty with swallowing, difficulty with speaking, unusual weakness, changes in your general condition, high fever, problems, or concerns.

## 2019-03-23 NOTE — ED Provider Notes (Signed)
[facility] EMERGENCY DEPARTMENT Provider Note   CSN: [id number] Arrival date & time: 03/23/19  2039     History   Chief Complaint No chief complaint on file.   HPI Walter Stuart is a 43 y.o. male.     Patient is a 43 year old male who presents to the emergency department following possible exposure to COVID-19.  The patient says that someone he has been around tested positive for the COVID-19 virus on yesterday September 26.  He says that he was around this person about 4 days ago.  And neither of them were wearing mask.  He says that 3 days ago he had some problems with vomiting.  He is unsure if it was related to something he was eating or other reasons.  He says during the last 2 days he has been noticing increased red                           and irritation of his.  He has not had any fever or chills.  There is been no loss of taste or smell.  No excessive cough.  No shortness of breath or difficulty speaking.  He presents to the emergency department at this time for assistance with this issue.     Past Medical History:  Diagnosis Date  . Gout 2011  . Hypertension 02-2015  . Meningitis    past hx. ? seizure"passed out"    Patient Active Problem List   Diagnosis Date Noted  . Essential hypertension, benign 05/24/2015  . Morbid obesity ([place]) 05/24/2015    Past Surgical History:  Procedure Laterality Date  . BICEPS TENDON REPAIR Left   . SHOULDER ARTHROSCOPY WITH SUBACROMIAL DECOMPRESSION Left 02/18/2014   Procedure: LEFT SHOULDER ARTHROSCOPY WITH SUBACROMIAL DECOMPRESSION, DEBRIDEMENT OF LABRAL TEAR AND BICEPS TENDON;  Surgeon: Johnn Hai, MD;  Location: [facility];  Service: Orthopedics;  Laterality: Left;        Home Medications    Prior to Admission medications   Medication Sig Start Date End Date Taking? Authorizing Provider  amLODipine (NORVASC) 5 MG tablet Take 1 tablet (5 mg total) by mouth daily. For blood pressure 03/18/19  Yes Stacks, Cletus Gash, MD   labetalol (NORMODYNE) 200 MG tablet Take 1 tablet (200 mg total) by mouth 2 (two) times daily. 02/14/19  Yes Claretta Fraise, MD    Family History History reviewed. No pertinent family history.  Social History Social History   Tobacco Use  . Smoking status: Current Every Day Smoker    Packs/day: 0.25    Years: 18.00    Pack years: 4.50    Types: Cigarettes  . Smokeless tobacco: Never Used  Substance Use Topics  . Alcohol use: Yes    Comment: occ. social  . Drug use: Yes    Types: Marijuana     Allergies   Lisinopril   Review of Systems Review of Systems  Constitutional: Negative for activity change and appetite change.  HENT: Positive for congestion. Negative for ear discharge, ear pain, facial swelling, nosebleeds, rhinorrhea, sneezing and tinnitus.   Eyes: Negative for photophobia, pain and discharge.  Respiratory: Negative for cough, choking, shortness of breath and wheezing.   Cardiovascular: Negative for chest pain, palpitations and leg swelling.  Gastrointestinal: Positive for vomiting. Negative for abdominal pain, blood in stool, constipation, diarrhea and nausea.  Genitourinary: Negative for difficulty urinating, dysuria, flank pain, frequency and hematuria.  Musculoskeletal: Negative for back pain, gait problem, myalgias and  neck pain.  Skin: Negative for color change, rash and wound.  Neurological: Negative for dizziness, seizures, syncope, facial asymmetry, speech difficulty, weakness and numbness.  Hematological: Negative for adenopathy. Does not bruise/bleed easily.  Psychiatric/Behavioral: Negative for agitation, confusion, hallucinations, self-injury and suicidal ideas. The patient is not nervous/anxious.      Physical Exam Updated Vital Signs BP (!) 195/118 (BP Location: Right Arm) Comment: says his BP is always high.  Pulse 63   Temp 98.8 F (37.1 C) (Oral)   Resp 18   Ht 5\' 7"  (1.702 m)   Wt 131.1 kg   SpO2 96%   BMI 45.26 kg/m   Physical  Exam Vitals signs and nursing note reviewed.  Constitutional:      Appearance: He is well-developed. He is not toxic-appearing.  HENT:     Head: Normocephalic.     Right Ear: Tympanic membrane and external ear normal.     Left Ear: Tympanic membrane and external ear normal.     Nose: Congestion present.  Eyes:     General: Lids are normal.     Pupils: Pupils are equal, round, and reactive to light.  Neck:     Musculoskeletal: Normal range of motion and neck supple.     Vascular: No carotid bruit.  Cardiovascular:     Rate and Rhythm: Normal rate and regular rhythm.     Pulses: Normal pulses.     Heart sounds: Normal heart sounds.  Pulmonary:     Effort: No respiratory distress.     Breath sounds: Normal breath sounds.  Abdominal:     General: Bowel sounds are normal.     Palpations: Abdomen is soft.     Tenderness: There is no abdominal tenderness. There is no guarding.  Musculoskeletal: Normal range of motion.  Lymphadenopathy:     Head:     Right side of head: No submandibular adenopathy.     Left side of head: No submandibular adenopathy.     Cervical: No cervical adenopathy.  Skin:    General: Skin is warm and dry.  Neurological:     Mental Status: He is alert and oriented to person, place, and time.     Cranial Nerves: No cranial nerve deficit.     Sensory: No sensory deficit.  Psychiatric:        Speech: Speech normal.      ED Treatments / Results  Labs (all labs ordered are listed, but only abnormal results are displayed) Labs Reviewed  SARS CORONAVIRUS 2 (TAT 6-24 HRS)    EKG None  Radiology No results found.  Procedures Procedures (including critical care time)  Medications Ordered in ED Medications - No data to display   Initial Impression / Assessment and Plan / ED Course  I have reviewed the triage vital signs and the nursing notes.  Pertinent labs & imaging results that were available during my care of the patient were reviewed by me and  considered in my medical decision making (see chart for details).       Final Clinical Impressions(s) / ED Diagnoses  mdm  Blood pressure is elevated at 195/118.  Patient has a history of hypertension.  I have asked the patient have this rechecked soon.  The patient states that he was exposed to someone who tested positive for COVID-19 virus about 4 5 days ago, and he would like to be tested.  The patient denies any recent symptoms.  He did have one episode of vomiting, but he  is unsure if this may have been related to something that he ate.  The patient also complains of having some problems with red eyes and eye irritation, but otherwise no symptomatology.  A culture for COVID-19 was obtained.  The patient is asked to quarantine until the results of this test returned.  I discussed with him the need to use his mass, to maintain proper distancing, and to increase fluids.  The patient is asked to monitor his temperature closely and to use Tylenol every 4 hours or ibuprofen every 6 hours if he should develop temperature elevations or body aches.  The patient acknowledges understanding of these instructions.   Final diagnoses:  Exposure to Covid-19 Virus    ED Discharge Orders    None       Ivery QualeBryant, Maijor Hornig, Cordelia Poche-C 03/23/19 2220    Bethann BerkshireZammit, Joseph, MD 03/26/19 1005

## 2019-03-23 NOTE — ED Triage Notes (Addendum)
Pt states that he knows someone tested + for covid yesterday. Wants to be tested. Said he thought he had a fever but hasnt actually took temp. And says his eyes are red from sinuses. No covid s/s

## 2019-03-24 LAB — SARS CORONAVIRUS 2 (TAT 6-24 HRS): SARS Coronavirus 2: POSITIVE — AB

## 2019-04-03 ENCOUNTER — Other Ambulatory Visit: Payer: Self-pay

## 2019-04-03 ENCOUNTER — Other Ambulatory Visit: Payer: Self-pay | Admitting: *Deleted

## 2019-04-03 ENCOUNTER — Ambulatory Visit
Admission: EM | Admit: 2019-04-03 | Discharge: 2019-04-03 | Disposition: A | Discharge status: Home / Self Care | Payer: BC Managed Care – PPO

## 2019-04-03 DIAGNOSIS — Z20828 Contact with and (suspected) exposure to other viral communicable diseases: Secondary | ICD-10-CM | POA: Diagnosis not present

## 2019-04-03 DIAGNOSIS — Z20822 Contact with and (suspected) exposure to covid-19: Secondary | ICD-10-CM

## 2019-04-05 LAB — NOVEL CORONAVIRUS, NAA: SARS-CoV-2, NAA: NOT DETECTED

## 2019-04-10 ENCOUNTER — Other Ambulatory Visit: Payer: Self-pay | Admitting: Family Medicine

## 2019-04-10 DIAGNOSIS — R200 Anesthesia of skin: Secondary | ICD-10-CM

## 2019-04-10 DIAGNOSIS — R202 Paresthesia of skin: Secondary | ICD-10-CM

## 2019-04-24 ENCOUNTER — Telehealth: Payer: Self-pay | Admitting: Family Medicine

## 2019-04-24 MED ORDER — LABETALOL HCL 200 MG PO TABS: 200.0000 mg | ORAL_TABLET | Freq: Two times a day (BID) | ORAL | 2 refills | Status: DC

## 2019-04-24 NOTE — Telephone Encounter (Signed)
What is the name of the medication? labetalol (NORMODYNE) 200 MG tablet  Have you contacted your pharmacy to request a refill? No out today. Pt has apt with Stacks 04/28/2019  Which pharmacy would you like this sent to? [facility]   Patient notified that their request is being sent to the clinical staff for review and that they should receive a call once it is complete. If they do not receive a call within 24 hours they can check with their pharmacy or our office.

## 2019-04-24 NOTE — Telephone Encounter (Signed)
Sent med

## 2019-04-29 ENCOUNTER — Ambulatory Visit: Payer: BC Managed Care – PPO | Admitting: Family Medicine

## 2019-05-08 ENCOUNTER — Ambulatory Visit: Payer: BC Managed Care – PPO | Admitting: Family Medicine

## 2019-05-09 ENCOUNTER — Encounter: Payer: Self-pay | Admitting: Family Medicine

## 2019-05-26 ENCOUNTER — Telehealth: Payer: Self-pay

## 2019-05-26 NOTE — Telephone Encounter (Signed)
Left detailed message on patients voicemail that he should not double up on meds and should not do anything strenuous. Advised that if he is at work he may need to go home. If he feels he needs to be evaluated this evening then go to nearest ER. If not then follow up with [place] here at office tomorrow

## 2019-05-26 NOTE — Telephone Encounter (Signed)
Please contact the patient tAKE AN EXTRA LABETOLOL

## 2019-05-26 NOTE — Telephone Encounter (Signed)
Patient states that he is currently at work. He got up late today around 1 and took 1 lebetalol. He had his BP checked at work and it is 210/110. His employer wants to know if he can take another lebatolol now and another this evening? Please review and advise

## 2019-05-27 NOTE — Telephone Encounter (Signed)
Aware of stacks note

## 2019-05-27 NOTE — Telephone Encounter (Signed)
LM to call back -jhb

## 2019-07-17 ENCOUNTER — Other Ambulatory Visit: Payer: Self-pay

## 2019-07-17 ENCOUNTER — Encounter (HOSPITAL_COMMUNITY): Payer: Self-pay | Admitting: Emergency Medicine

## 2019-07-17 ENCOUNTER — Emergency Department (HOSPITAL_COMMUNITY)
Admission: EM | Admit: 2019-07-17 | Discharge: 2019-07-17 | Disposition: A | Discharge status: Home / Self Care | Payer: BC Managed Care – PPO | Attending: Emergency Medicine | Admitting: Emergency Medicine

## 2019-07-17 DIAGNOSIS — Z79899 Other long term (current) drug therapy: Secondary | ICD-10-CM | POA: Diagnosis not present

## 2019-07-17 DIAGNOSIS — I10 Essential (primary) hypertension: Secondary | ICD-10-CM | POA: Insufficient documentation

## 2019-07-17 DIAGNOSIS — F17210 Nicotine dependence, cigarettes, uncomplicated: Secondary | ICD-10-CM | POA: Insufficient documentation

## 2019-07-17 DIAGNOSIS — R519 Headache, unspecified: Secondary | ICD-10-CM | POA: Diagnosis not present

## 2019-07-17 LAB — BASIC METABOLIC PANEL
Anion gap: 7 (ref 5–15)
BUN: 20 mg/dL (ref 6–20)
CO2: 24 mmol/L (ref 22–32)
Calcium: 8.9 mg/dL (ref 8.9–10.3)
Chloride: 106 mmol/L (ref 98–111)
Creatinine, Ser: 1.13 mg/dL (ref 0.61–1.24)
GFR calc Af Amer: >60 mL/min (ref >60)
GFR calc non Af Amer: >60 mL/min (ref >60)
Glucose, Bld: 89 mg/dL (ref 70–99)
Potassium: 4.2 mmol/L (ref 3.5–5.1)
Sodium: 137 mmol/L (ref 135–145)

## 2019-07-17 LAB — CBC WITH DIFFERENTIAL/PLATELET
Abs Immature Granulocytes: 0.01 10*3/uL (ref 0.00–0.07)
Basophils Absolute: 0 10*3/uL (ref 0.0–0.1)
Basophils Relative: 1 %
Eosinophils Absolute: 0.2 10*3/uL (ref 0.0–0.5)
Eosinophils Relative: 4 %
HCT: 38.9 % — ABNORMAL LOW (ref 39.0–52.0)
Hemoglobin: 13.1 g/dL (ref 13.0–17.0)
Immature Granulocytes: 0 %
Lymphocytes Relative: 35 %
Lymphs Abs: 1.4 10*3/uL (ref 0.7–4.0)
MCH: 30.2 pg (ref 26.0–34.0)
MCHC: 33.7 g/dL (ref 30.0–36.0)
MCV: 89.6 fL (ref 80.0–100.0)
Monocytes Absolute: 0.4 10*3/uL (ref 0.1–1.0)
Monocytes Relative: 10 %
Neutro Abs: 2 10*3/uL (ref 1.7–7.7)
Neutrophils Relative %: 50 %
Platelets: 257 10*3/uL (ref 150–400)
RBC: 4.34 MIL/uL (ref 4.22–5.81)
RDW: 12.7 % (ref 11.5–15.5)
WBC: 4 10*3/uL (ref 4.0–10.5)
nRBC: 0 % (ref 0.0–0.2)

## 2019-07-17 MED ORDER — KETOROLAC TROMETHAMINE 60 MG/2ML IM SOLN
60.0000 mg | Freq: Once | INTRAMUSCULAR | Status: AC
  Administered 2019-07-17: 20:00:00 60 mg via INTRAMUSCULAR
  Filled 2019-07-17: qty 2

## 2019-07-17 MED ORDER — METOCLOPRAMIDE HCL 5 MG/ML IJ SOLN
10.0000 mg | Freq: Once | INTRAMUSCULAR | Status: AC
  Administered 2019-07-17: 20:00:00 10 mg via INTRAMUSCULAR
  Filled 2019-07-17: qty 2

## 2019-07-17 MED ORDER — DIPHENHYDRAMINE HCL 25 MG PO CAPS
25.0000 mg | ORAL_CAPSULE | Freq: Once | ORAL | Status: AC
  Administered 2019-07-17: 20:00:00 25 mg via ORAL
  Filled 2019-07-17: qty 1

## 2019-07-17 NOTE — ED Provider Notes (Signed)
[facility] EMERGENCY DEPARTMENT Provider Note   CSN: [id number] Arrival date & time: 07/17/19  1631     History Chief Complaint  Patient presents with  . Headache    Walter Stuart is a 44 y.o. male.  HPI      Walter Stuart is a 44 y.o. male who presents to the Emergency Department complaining of frontal headache upon waking this morning.  He describes a history of frequent headaches and states current pain feels similar to previous.  He states that he woke with a "pressure" to his forehead that radiates to both temples.  It has been waxing and waning in severity.  He describes a "weakness" of his right eye this morning upon waking, but was brief in duration and he did not have any other facial weakness, visual changes or speech difficulties.  No description of thunderclap headache.  He states that his blood pressure has been elevated recently and he has been without his blood pressure medication for 2 days.  He did take 1 dose earlier today.  He denies chest pain, shortness of breath, nausea or vomiting, neck pain or stiffness and photophobia.  He has not taken any medication for symptomatic relief.    Past Medical History:  Diagnosis Date  . Gout 2011  . Hypertension 02-2015  . Meningitis    past hx. ? seizure"passed out"    Patient Active Problem List   Diagnosis Date Noted  . Essential hypertension, benign 05/24/2015  . Morbid obesity ([facility]) 05/24/2015    Past Surgical History:  Procedure Laterality Date  . BICEPS TENDON REPAIR Left   . SHOULDER ARTHROSCOPY WITH SUBACROMIAL DECOMPRESSION Left 02/18/2014   Procedure: LEFT SHOULDER ARTHROSCOPY WITH SUBACROMIAL DECOMPRESSION, DEBRIDEMENT OF LABRAL TEAR AND BICEPS TENDON;  Surgeon: Javier Docker, MD;  Location: [facility];  Service: Orthopedics;  Laterality: Left;       No family history on file.  Social History   Tobacco Use  . Smoking status: Current Every Day Smoker    Packs/day: 0.25    Years: 18.00    Pack  years: 4.50    Types: Cigarettes  . Smokeless tobacco: Never Used  Substance Use Topics  . Alcohol use: Yes    Comment: occ. social  . Drug use: Yes    Types: Marijuana    Home Medications Prior to Admission medications   Medication Sig Start Date End Date Taking? Authorizing Provider  amLODipine (NORVASC) 5 MG tablet Take 1 tablet (5 mg total) by mouth daily. For blood pressure 03/18/19   Mechele Claude, MD  labetalol (NORMODYNE) 200 MG tablet Take 1 tablet (200 mg total) by mouth 2 (two) times daily. 04/24/19   Mechele Claude, MD    Allergies    Lisinopril  Review of Systems   Review of Systems  Constitutional: Negative for activity change, appetite change and fever.  HENT: Negative for facial swelling and trouble swallowing.   Eyes: Positive for photophobia. Negative for pain and visual disturbance.  Respiratory: Negative for shortness of breath.   Cardiovascular: Negative for chest pain.  Gastrointestinal: Negative for nausea and vomiting.  Musculoskeletal: Negative for neck pain and neck stiffness.  Skin: Negative for rash and wound.  Neurological: Positive for headaches. Negative for dizziness, facial asymmetry, speech difficulty, weakness and numbness.  Psychiatric/Behavioral: Negative for confusion and decreased concentration.  All other systems reviewed and are negative.   Physical Exam Updated Vital Signs BP (!) 184/99 (BP Location: Right Arm)   Pulse 74  Temp 98.4 F (36.9 C) (Oral)   Resp 18   Ht 5\' 6"  (1.676 m)   Wt 136.1 kg   SpO2 96%   BMI 48.42 kg/m   Physical Exam Vitals and nursing note reviewed.  Constitutional:      General: He is not in acute distress.    Appearance: He is well-developed.  HENT:     Head: Normocephalic and atraumatic.  Eyes:     Conjunctiva/sclera: Conjunctivae normal.     Pupils: Pupils are equal, round, and reactive to light.  Neck:     Trachea: Phonation normal.     Meningeal: Kernig's sign absent.  Cardiovascular:       Rate and Rhythm: Normal rate and regular rhythm.  Pulmonary:     Effort: Pulmonary effort is normal. No respiratory distress.     Breath sounds: Normal breath sounds.  Chest:     Chest wall: No tenderness.  Abdominal:     General: There is no distension.     Palpations: Abdomen is soft.     Tenderness: There is no abdominal tenderness.  Musculoskeletal:        General: Normal range of motion.     Cervical back: Normal range of motion and neck supple. No rigidity. No spinous process tenderness or muscular tenderness.  Skin:    General: Skin is warm and dry.     Capillary Refill: Capillary refill takes less than 2 seconds.     Findings: No rash.  Neurological:     Mental Status: He is alert and oriented to person, place, and time.     GCS: GCS eye subscore is 4. GCS verbal subscore is 5. GCS motor subscore is 6.     Cranial Nerves: No cranial nerve deficit.     Sensory: No sensory deficit.     Motor: No abnormal muscle tone.     Coordination: Coordination normal.     Gait: Gait normal.     Deep Tendon Reflexes:     Reflex Scores:      Tricep reflexes are 2+ on the right side and 2+ on the left side.      Bicep reflexes are 2+ on the right side and 2+ on the left side.    Comments: CN III-XII grossly intact.  Speech clear.  No dysarthria.  No pronator drift, nml finger nose and heel shin testing.  No facial weakness  Psychiatric:        Thought Content: Thought content normal.     ED Results / Procedures / Treatments   Labs (all labs ordered are listed, but only abnormal results are displayed) Labs Reviewed  CBC WITH DIFFERENTIAL/PLATELET - Abnormal; Notable for the following components:      Result Value   HCT 38.9 (*)    All other components within normal limits  BASIC METABOLIC PANEL    EKG EKG Interpretation  Date/Time:  Thursday July 17 2019 16:42:33 EST Ventricular Rate:  76 PR Interval:  130 QRS Duration: 92 QT Interval:  392 QTC Calculation: 441 R  Axis:   -8 Text Interpretation: Normal sinus rhythm Cannot rule out Anterior infarct , age undetermined Abnormal ECG since last tracing no significant change Confirmed by [id number] [phone]) on 07/17/2019 5:37:46 PM   Radiology No results found.  Procedures Procedures (including critical care time)  Medications Ordered in ED Medications  ketorolac (TORADOL) injection 60 mg (60 mg Intramuscular Given 07/17/19 1954)  diphenhydrAMINE (BENADRYL) capsule 25 mg (25 mg Oral Given  07/17/19 1953)  metoCLOPramide (REGLAN) injection 10 mg (10 mg Intramuscular Given 07/17/19 1953)    ED Course  I have reviewed the triage vital signs and the nursing notes.  Pertinent labs & imaging results that were available during my care of the patient were reviewed by me and considered in my medical decision making (see chart for details).    MDM Rules/Calculators/A&P                       Patient with history of frequent headaches.  Headache today upon waking.  Headache is been waxing and waning in severity this evening.  Patient does have history of hypertension and has been without his blood pressure medications for 2 days.  On exam, he is well-appearing, ambulatory with steady gait.  No focal neuro deficits on exam.  No meningeal signs and no facial weakness or concerning sx's for Bell's palsy.    On recheck, patient reports feeling better and headache is much improved.  He is now sitting in a chair watching television.  On arrival, blood pressure was 184/99 it is now 167/100.  He is due for his evening dose of his labetalol and Norvasc.  He agrees to take these medications when he returns home.  I feel that he is appropriate for discharge home.  I have counseled him on the importance of keeping his blood pressure well controlled and taking his medications daily on a regular basis.  Patient verbalized understanding, he agrees to close follow-up with his PCP.  He may also benefit from neurology follow-up  regarding the recurring headaches.   Final Clinical Impression(s) / ED Diagnoses Final diagnoses:  Headache disorder  Hypertension, unspecified type    Rx / DC Orders ED Discharge Orders    None       Bufford Lope 07/17/19 2123    Daleen Bo, MD 07/21/19 [phone]

## 2019-07-17 NOTE — Discharge Instructions (Signed)
Your blood pressure this evening is still elevated.  It is important that you take your amlodipine and labetalol as directed when you return home.  It is important that you take these medications on a regular basis every day.  Contact your primary care provider to arrange a follow-up appointment in 2 to 3 days.  I have also provided information for a local neurologist that you can contact to follow-up with regarding your frequent headaches.  Return to the emergency room if you develop any worsening symptoms.

## 2019-09-02 ENCOUNTER — Telehealth: Payer: Self-pay | Admitting: Family Medicine

## 2019-09-02 NOTE — Telephone Encounter (Signed)
It should be fine. He may want to have his BP checked after taking it for a couple of weeks just to be sure. WS

## 2019-09-02 NOTE — Telephone Encounter (Signed)
Patient would like to know if there is any harm in taking OTC Keto Slim pills with his blood pressure medication.

## 2019-09-02 NOTE — Telephone Encounter (Signed)
Called patient, no answer, no option to leave message 

## 2019-09-05 NOTE — Telephone Encounter (Signed)
Aware of provider's advice. 

## 2019-11-20 ENCOUNTER — Encounter (HOSPITAL_COMMUNITY): Payer: Self-pay | Admitting: Emergency Medicine

## 2019-11-20 ENCOUNTER — Emergency Department (HOSPITAL_COMMUNITY)
Admission: EM | Admit: 2019-11-20 | Discharge: 2019-11-20 | Disposition: A | Discharge status: Home / Self Care | Payer: BC Managed Care – PPO | Attending: Emergency Medicine | Admitting: Emergency Medicine

## 2019-11-20 ENCOUNTER — Emergency Department (HOSPITAL_COMMUNITY): Payer: BC Managed Care – PPO

## 2019-11-20 ENCOUNTER — Other Ambulatory Visit: Payer: Self-pay

## 2019-11-20 DIAGNOSIS — Z79899 Other long term (current) drug therapy: Secondary | ICD-10-CM | POA: Insufficient documentation

## 2019-11-20 DIAGNOSIS — I10 Essential (primary) hypertension: Secondary | ICD-10-CM | POA: Insufficient documentation

## 2019-11-20 DIAGNOSIS — R0981 Nasal congestion: Secondary | ICD-10-CM | POA: Insufficient documentation

## 2019-11-20 DIAGNOSIS — J40 Bronchitis, not specified as acute or chronic: Secondary | ICD-10-CM | POA: Diagnosis not present

## 2019-11-20 DIAGNOSIS — F17210 Nicotine dependence, cigarettes, uncomplicated: Secondary | ICD-10-CM | POA: Diagnosis not present

## 2019-11-20 DIAGNOSIS — F1210 Cannabis abuse, uncomplicated: Secondary | ICD-10-CM | POA: Diagnosis not present

## 2019-11-20 DIAGNOSIS — R059 Cough, unspecified: Secondary | ICD-10-CM

## 2019-11-20 DIAGNOSIS — Z20822 Contact with and (suspected) exposure to covid-19: Secondary | ICD-10-CM | POA: Insufficient documentation

## 2019-11-20 DIAGNOSIS — R05 Cough: Secondary | ICD-10-CM | POA: Diagnosis not present

## 2019-11-20 DIAGNOSIS — R0602 Shortness of breath: Secondary | ICD-10-CM | POA: Diagnosis not present

## 2019-11-20 LAB — CBC
HCT: 38.4 % — ABNORMAL LOW (ref 39.0–52.0)
Hemoglobin: 12.4 g/dL — ABNORMAL LOW (ref 13.0–17.0)
MCH: 30.1 pg (ref 26.0–34.0)
MCHC: 32.3 g/dL (ref 30.0–36.0)
MCV: 93.2 fL (ref 80.0–100.0)
Platelets: 235 10*3/uL (ref 150–400)
RBC: 4.12 MIL/uL — ABNORMAL LOW (ref 4.22–5.81)
RDW: 13.2 % (ref 11.5–15.5)
WBC: 5.4 10*3/uL (ref 4.0–10.5)
nRBC: 0 % (ref 0.0–0.2)

## 2019-11-20 LAB — COMPREHENSIVE METABOLIC PANEL
ALT: 54 U/L — ABNORMAL HIGH (ref 0–44)
AST: 59 U/L — ABNORMAL HIGH (ref 15–41)
Albumin: 4 g/dL (ref 3.5–5.0)
Alkaline Phosphatase: 69 U/L (ref 38–126)
Anion gap: 8 (ref 5–15)
BUN: 19 mg/dL (ref 6–20)
CO2: 24 mmol/L (ref 22–32)
Calcium: 8.8 mg/dL — ABNORMAL LOW (ref 8.9–10.3)
Chloride: 107 mmol/L (ref 98–111)
Creatinine, Ser: 1.22 mg/dL (ref 0.61–1.24)
GFR calc Af Amer: >60 mL/min (ref >60)
GFR calc non Af Amer: >60 mL/min (ref >60)
Glucose, Bld: 96 mg/dL (ref 70–99)
Potassium: 4.2 mmol/L (ref 3.5–5.1)
Sodium: 139 mmol/L (ref 135–145)
Total Bilirubin: 0.4 mg/dL (ref 0.3–1.2)
Total Protein: 7.6 g/dL (ref 6.5–8.1)

## 2019-11-20 LAB — SARS CORONAVIRUS 2 BY RT PCR (HOSPITAL ORDER, PERFORMED IN ~~LOC~~ HOSPITAL LAB): SARS Coronavirus 2: NEGATIVE

## 2019-11-20 MED ORDER — DOXYCYCLINE HYCLATE 100 MG PO CAPS: 100.0000 mg | ORAL_CAPSULE | Freq: Two times a day (BID) | ORAL | 0 refills | Status: DC

## 2019-11-20 MED ORDER — DOXYCYCLINE HYCLATE 100 MG PO CAPS: 100.0000 mg | ORAL_CAPSULE | Freq: Two times a day (BID) | ORAL | 0 refills | Status: AC

## 2019-11-20 NOTE — ED Triage Notes (Signed)
Pt C/O cough, SOB, and "phlem in my chest." Pt denies pain at this time.

## 2019-11-20 NOTE — ED Provider Notes (Signed)
[facility] EMERGENCY DEPARTMENT Provider Note   CSN: [id number] Arrival date & time: 11/20/19  1820     History Chief Complaint  Patient presents with  . Shortness of Breath    Salik Grewell is a 44 y.o. male.  Patient complains of cough and congestion for over a week.  No fevers no chills no vomiting  The history is provided by the patient. No language interpreter was used.  Shortness of Breath Severity:  Moderate Onset quality:  Sudden Timing:  Constant Progression:  Worsening Chronicity:  New Context: activity   Relieved by:  Nothing Worsened by:  Nothing Associated symptoms: no abdominal pain, no chest pain, no cough, no headaches and no rash        Past Medical History:  Diagnosis Date  . Gout 2011  . Hypertension 02-2015  . Meningitis    past hx. ? seizure"passed out"    Patient Active Problem List   Diagnosis Date Noted  . Essential hypertension, benign 05/24/2015  . Morbid obesity (HCC) 05/24/2015    Past Surgical History:  Procedure Laterality Date  . BICEPS TENDON REPAIR Left   . SHOULDER ARTHROSCOPY WITH SUBACROMIAL DECOMPRESSION Left 02/18/2014   Procedure: LEFT SHOULDER ARTHROSCOPY WITH SUBACROMIAL DECOMPRESSION, DEBRIDEMENT OF LABRAL TEAR AND BICEPS TENDON;  Surgeon: Javier Docker, MD;  Location: [facility];  Service: Orthopedics;  Laterality: Left;       No family history on file.  Social History   Tobacco Use  . Smoking status: Current Every Day Smoker    Packs/day: 0.25    Years: 18.00    Pack years: 4.50    Types: Cigarettes  . Smokeless tobacco: Never Used  Substance Use Topics  . Alcohol use: Yes    Comment: occ. social  . Drug use: Yes    Types: Marijuana    Home Medications Prior to Admission medications   Medication Sig Start Date End Date Taking? Authorizing Provider  amLODipine (NORVASC) 5 MG tablet Take 1 tablet (5 mg total) by mouth daily. For blood pressure 03/18/19   Mechele Claude, MD  doxycycline (VIBRAMYCIN)  100 MG capsule Take 1 capsule (100 mg total) by mouth 2 (two) times daily. One po bid x 7 days 11/20/19   Bethann Berkshire, MD  labetalol (NORMODYNE) 200 MG tablet Take 1 tablet (200 mg total) by mouth 2 (two) times daily. 04/24/19   Mechele Claude, MD    Allergies    Lisinopril  Review of Systems   Review of Systems  Constitutional: Negative for appetite change and fatigue.  HENT: Negative for congestion, ear discharge and sinus pressure.   Eyes: Negative for discharge.  Respiratory: Positive for shortness of breath. Negative for cough.   Cardiovascular: Negative for chest pain.  Gastrointestinal: Negative for abdominal pain and diarrhea.  Genitourinary: Negative for frequency and hematuria.  Musculoskeletal: Negative for back pain.  Skin: Negative for rash.  Neurological: Negative for seizures and headaches.  Psychiatric/Behavioral: Negative for hallucinations.    Physical Exam Updated Vital Signs BP (!) 161/86 (BP Location: Right Arm)   Pulse 70   Temp 98.2 F (36.8 C) (Oral)   Resp 17   Ht 5\' 6"  (1.676 m)   Wt (!) 145.6 kg   SpO2 98%   BMI 51.79 kg/m   Physical Exam Vitals and nursing note reviewed.  Constitutional:      Appearance: He is well-developed.  HENT:     Head: Normocephalic.     Nose: Nose normal.  Eyes:  General: No scleral icterus.    Conjunctiva/sclera: Conjunctivae normal.  Neck:     Thyroid: No thyromegaly.  Cardiovascular:     Rate and Rhythm: Normal rate and regular rhythm.     Heart sounds: No murmur. No friction rub. No gallop.   Pulmonary:     Breath sounds: No stridor. No wheezing or rales.  Chest:     Chest wall: No tenderness.  Abdominal:     General: There is no distension.     Tenderness: There is no abdominal tenderness. There is no rebound.  Musculoskeletal:        General: Normal range of motion.     Cervical back: Neck supple.  Lymphadenopathy:     Cervical: No cervical adenopathy.  Skin:    Findings: No erythema or  rash.  Neurological:     Mental Status: He is alert and oriented to person, place, and time.     Motor: No abnormal muscle tone.     Coordination: Coordination normal.  Psychiatric:        Behavior: Behavior normal.     ED Results / Procedures / Treatments   Labs (all labs ordered are listed, but only abnormal results are displayed) Labs Reviewed  CBC - Abnormal; Notable for the following components:      Result Value   RBC 4.12 (*)    Hemoglobin 12.4 (*)    HCT 38.4 (*)    All other components within normal limits  COMPREHENSIVE METABOLIC PANEL - Abnormal; Notable for the following components:   Calcium 8.8 (*)    AST 59 (*)    ALT 54 (*)    All other components within normal limits  SARS CORONAVIRUS 2 BY RT PCR (HOSPITAL ORDER, [place] [facility])    EKG None  Radiology DG Chest Port 1 View  Result Date: 11/20/2019 CLINICAL DATA:  Cough EXAM: PORTABLE CHEST 1 VIEW COMPARISON:  2019 FINDINGS: The heart size and mediastinal contours are likely within normal limits for technique. Both lungs are clear. No pleural effusion. The visualized skeletal structures are unremarkable. IMPRESSION: No acute process in the chest. Electronically Signed   By: Macy Mis M.D.   On: 11/20/2019 21:24    Procedures Procedures (including critical care time)  Medications Ordered in ED Medications - No data to display  ED Course  I have reviewed the triage vital signs and the nursing notes.  Pertinent labs & imaging results that were available during my care of the patient were reviewed by me and considered in my medical decision making (see chart for details).    MDM Rules/Calculators/A&P                      Patient with negative Covid test.  Patient with bronchitis he will be placed on doxycycline       This patient presents to the ED for concern of cough shortness of breath this involves an extensive number of treatment options, and is a complaint that  carries with it a high risk of complications and morbidity.  The differential diagnosis includes bronchitis pneumonia COVID-19   Lab Tests:   I Ordered, reviewed, and interpreted labs, which included CBC chemistries Covid test.  Covid test negative hemoglobin mildly low  Medicines ordered:   Imaging Studies ordered:   I ordered imaging studies which included chest x-ray and  I independently visualized and interpreted imaging which showed no acute disease  Additional history obtained:  Additional history obtained from records  Previous records obtained and reviewed   Consultations Obtained:   Reevaluation:  After the interventions stated above, I reevaluated the patient and found unchanged  Critical Interventions:  .   Final Clinical Impression(s) / ED Diagnoses Final diagnoses:  Bronchitis    Rx / DC Orders ED Discharge Orders         Ordered    doxycycline (VIBRAMYCIN) 100 MG capsule  2 times daily     11/20/19 2303           Bethann Berkshire, MD 11/21/19 1247

## 2019-11-20 NOTE — Discharge Instructions (Addendum)
Follow-up with your family doctor if not improving 

## 2020-01-02 ENCOUNTER — Ambulatory Visit: Payer: BC Managed Care – PPO | Admitting: Family Medicine

## 2020-01-05 ENCOUNTER — Encounter: Payer: Self-pay | Admitting: General Practice

## 2020-03-24 ENCOUNTER — Other Ambulatory Visit: Payer: Self-pay

## 2020-03-24 ENCOUNTER — Encounter (HOSPITAL_COMMUNITY): Payer: Self-pay | Admitting: Emergency Medicine

## 2020-03-24 ENCOUNTER — Emergency Department (HOSPITAL_COMMUNITY)
Admission: EM | Admit: 2020-03-24 | Discharge: 2020-03-24 | Disposition: A | Discharge status: Home / Self Care | Payer: BC Managed Care – PPO | Attending: Emergency Medicine | Admitting: Emergency Medicine

## 2020-03-24 DIAGNOSIS — R635 Abnormal weight gain: Secondary | ICD-10-CM | POA: Diagnosis not present

## 2020-03-24 DIAGNOSIS — I10 Essential (primary) hypertension: Secondary | ICD-10-CM | POA: Insufficient documentation

## 2020-03-24 DIAGNOSIS — R030 Elevated blood-pressure reading, without diagnosis of hypertension: Secondary | ICD-10-CM | POA: Diagnosis not present

## 2020-03-24 DIAGNOSIS — F17210 Nicotine dependence, cigarettes, uncomplicated: Secondary | ICD-10-CM | POA: Diagnosis not present

## 2020-03-24 MED ORDER — AMLODIPINE BESYLATE 5 MG PO TABS: 5.0000 mg | ORAL_TABLET | Freq: Every day | ORAL | 2 refills | Status: AC

## 2020-03-24 MED ORDER — LABETALOL HCL 200 MG PO TABS: 200.0000 mg | ORAL_TABLET | Freq: Two times a day (BID) | ORAL | 2 refills | Status: AC

## 2020-03-24 NOTE — ED Provider Notes (Signed)
[facility] EMERGENCY DEPARTMENT Provider Note   CSN: [id number] Arrival date & time: 03/24/20  1908     History Chief Complaint  Patient presents with  . Hypertension    Walter Stuart is a 44 y.o. male.  HPI He presents for evaluation of blood pressure, which was high when he checked it before work today.  After talking to his employer, he decided come here to get checked.  He takes his blood pressure medicines, sporadically, and states he "needs refills."  He also states that he has gained about 50 pounds, since last year.  He has been "eating a lot of TV dinners."  He states he has trouble with his diet because he works late.  He denies headache, chest pain, shortness of breath, weakness or dizziness.  No other recent illnesses.  There are no other known modifying factors.    Past Medical History:  Diagnosis Date  . Gout 2011  . Hypertension 02-2015  . Meningitis    past hx. ? seizure"passed out"    Patient Active Problem List   Diagnosis Date Noted  . Essential hypertension, benign 05/24/2015  . Morbid obesity (HCC) 05/24/2015    Past Surgical History:  Procedure Laterality Date  . BICEPS TENDON REPAIR Left   . SHOULDER ARTHROSCOPY WITH SUBACROMIAL DECOMPRESSION Left 02/18/2014   Procedure: LEFT SHOULDER ARTHROSCOPY WITH SUBACROMIAL DECOMPRESSION, DEBRIDEMENT OF LABRAL TEAR AND BICEPS TENDON;  Surgeon: Javier Docker, MD;  Location: [facility];  Service: Orthopedics;  Laterality: Left;       History reviewed. No pertinent family history.  Social History   Tobacco Use  . Smoking status: Current Every Day Smoker    Packs/day: 0.25    Years: 18.00    Pack years: 4.50    Types: Cigarettes  . Smokeless tobacco: Never Used  Vaping Use  . Vaping Use: Never used  Substance Use Topics  . Alcohol use: Yes    Comment: occ. social  . Drug use: Yes    Types: Marijuana    Home Medications Prior to Admission medications   Medication Sig Start Date End Date Taking?  Authorizing Provider  amLODipine (NORVASC) 5 MG tablet Take 1 tablet (5 mg total) by mouth daily. For blood pressure 03/24/20   Mancel Bale, MD  doxycycline (VIBRAMYCIN) 100 MG capsule Take 1 capsule (100 mg total) by mouth 2 (two) times daily. One po bid x 7 days 11/20/19   Bethann Berkshire, MD  labetalol (NORMODYNE) 200 MG tablet Take 1 tablet (200 mg total) by mouth 2 (two) times daily. 03/24/20   Mancel Bale, MD    Allergies    Lisinopril  Review of Systems   Review of Systems  All other systems reviewed and are negative.   Physical Exam Updated Vital Signs BP (!) 193/113 (BP Location: Right Arm)   Pulse 75   Temp 98.7 F (37.1 C) (Oral)   Resp (!) 22   Ht 5\' 6"  (1.676 m)   Wt (!) 147.4 kg   SpO2 97%   BMI 52.46 kg/m   Physical Exam Vitals and nursing note reviewed.  Constitutional:      General: He is not in acute distress.    Appearance: He is well-developed. He is obese. He is not ill-appearing, toxic-appearing or diaphoretic.  HENT:     Head: Normocephalic and atraumatic.     Right Ear: External ear normal.     Left Ear: External ear normal.  Eyes:     Conjunctiva/sclera: Conjunctivae normal.  Pupils: Pupils are equal, round, and reactive to light.  Neck:     Trachea: Phonation normal.  Cardiovascular:     Rate and Rhythm: Normal rate.  Pulmonary:     Effort: Pulmonary effort is normal.  Abdominal:     General: There is no distension.  Musculoskeletal:        General: Normal range of motion.     Cervical back: Normal range of motion and neck supple.  Skin:    General: Skin is warm and dry.  Neurological:     Mental Status: He is alert and oriented to person, place, and time.     Cranial Nerves: No cranial nerve deficit.     Sensory: No sensory deficit.     Motor: No abnormal muscle tone.     Coordination: Coordination normal.     Comments: No dysarthria or aphasia  Psychiatric:        Mood and Affect: Mood normal.        Behavior: Behavior  normal.        Thought Content: Thought content normal.        Judgment: Judgment normal.     ED Results / Procedures / Treatments   Labs (all labs ordered are listed, but only abnormal results are displayed) Labs Reviewed - No data to display  EKG None  Radiology No results found.  Procedures Procedures (including critical care time)  Medications Ordered in ED Medications - No data to display  ED Course  I have reviewed the triage vital signs and the nursing notes.  Pertinent labs & imaging results that were available during my care of the patient were reviewed by me and considered in my medical decision making (see chart for details).    MDM Rules/Calculators/A&P                           Patient Vitals for the past 24 hrs:  BP Temp Temp src Pulse Resp SpO2 Height Weight  03/24/20 1938 -- -- -- -- -- -- 5\' 6"  (1.676 m) (!) 147.4 kg  03/24/20 1936 (!) 193/113 98.7 F (37.1 C) Oral 75 (!) 22 97 % -- --    8:28 PM Reevaluation with update and discussion. After initial assessment and treatment, an updated evaluation reveals no change in status, 5 discussed with patient all questions were answered. 03/26/20   Medical Decision Making:  This patient is presenting for evaluation of high blood pressure, which does not require a range of treatment options, and is not a complaint that involves a high risk of morbidity and mortality. The differential diagnoses include hypertensive urgency, asymptomatic hypertension, medication noncompliance, nutritional noncompliance. I decided to review old records, and in summary obese male, with dietary indiscretions, and sporadically using antihypertensives presenting without symptoms of high blood pressure..  I did not require additional historical information from anyone.    Critical Interventions-clinical evaluation, discussion of findings and treatment plan  After These Interventions, the Patient was reevaluated and was found  stable for discharge.  No clinical evidence of hypertensive urgency, or requirement for further ED evaluation.  He has access to follow-up care and ability to get medications.  He has weight gain and dietary problems, contributing to high blood pressure.  CRITICAL CARE-no Performed by: Mancel Bale  Nursing Notes Reviewed/ Care Coordinated Applicable Imaging Reviewed Interpretation of Laboratory Data incorporated into ED treatment  The patient appears reasonably screened and/or stabilized for discharge and I  doubt any other medical condition or other [facility] requiring further screening, evaluation, or treatment in the ED at this time prior to discharge.  Plan: Home Medications-continue usual prescribed medicines for high blood pressure; Home Treatments-low-sodium diet; return here if the recommended treatment, does not improve the symptoms; Recommended follow up-PCP follow-up 1 week and as needed     Final Clinical Impression(s) / ED Diagnoses Final diagnoses:  Hypertension, unspecified type  Weight gain    Rx / DC Orders ED Discharge Orders         Ordered    amLODipine (NORVASC) 5 MG tablet  Daily        03/24/20 2023    labetalol (NORMODYNE) 200 MG tablet  2 times daily        03/24/20 2023           Mancel Bale, MD 03/24/20 2029

## 2020-03-24 NOTE — ED Triage Notes (Signed)
Pt sent by employer due to HBP. Denies symptoms. Pt NAD during triage

## 2020-03-24 NOTE — Discharge Instructions (Signed)
We sent prescriptions to your pharmacy to take for blood pressure.  It is in important to stay on a low-sodium diet.  Please contact your PCP, to get an appointment to be seen for blood pressure check and nutrition counseling as soon as possible.

## 2020-04-15 DIAGNOSIS — M5442 Lumbago with sciatica, left side: Secondary | ICD-10-CM | POA: Diagnosis not present

## 2020-04-15 DIAGNOSIS — F064 Anxiety disorder due to known physiological condition: Secondary | ICD-10-CM | POA: Diagnosis not present

## 2020-04-15 DIAGNOSIS — I10 Essential (primary) hypertension: Secondary | ICD-10-CM | POA: Diagnosis not present

## 2020-04-15 DIAGNOSIS — A549 Gonococcal infection, unspecified: Secondary | ICD-10-CM | POA: Diagnosis not present

## 2020-04-28 DIAGNOSIS — Z114 Encounter for screening for human immunodeficiency virus [HIV]: Secondary | ICD-10-CM | POA: Diagnosis not present

## 2020-06-11 DIAGNOSIS — Z23 Encounter for immunization: Secondary | ICD-10-CM | POA: Diagnosis not present

## 2020-07-07 DIAGNOSIS — I4510 Unspecified right bundle-branch block: Secondary | ICD-10-CM | POA: Diagnosis not present

## 2020-07-07 DIAGNOSIS — N289 Disorder of kidney and ureter, unspecified: Secondary | ICD-10-CM | POA: Diagnosis not present

## 2020-07-07 DIAGNOSIS — R079 Chest pain, unspecified: Secondary | ICD-10-CM | POA: Diagnosis not present

## 2020-07-07 DIAGNOSIS — I517 Cardiomegaly: Secondary | ICD-10-CM | POA: Diagnosis not present

## 2020-07-20 DIAGNOSIS — R079 Chest pain, unspecified: Secondary | ICD-10-CM | POA: Diagnosis not present

## 2020-07-20 DIAGNOSIS — R0789 Other chest pain: Secondary | ICD-10-CM | POA: Diagnosis not present

## 2020-07-20 DIAGNOSIS — Z720 Tobacco use: Secondary | ICD-10-CM | POA: Diagnosis not present

## 2020-07-20 DIAGNOSIS — F17200 Nicotine dependence, unspecified, uncomplicated: Secondary | ICD-10-CM | POA: Diagnosis not present

## 2020-07-20 DIAGNOSIS — I10 Essential (primary) hypertension: Secondary | ICD-10-CM | POA: Diagnosis not present

## 2020-09-21 DIAGNOSIS — Z6841 Body Mass Index (BMI) 40.0 and over, adult: Secondary | ICD-10-CM | POA: Diagnosis not present

## 2020-09-21 DIAGNOSIS — Z0000 Encounter for general adult medical examination without abnormal findings: Secondary | ICD-10-CM | POA: Diagnosis not present

## 2020-12-31 DIAGNOSIS — Z6841 Body Mass Index (BMI) 40.0 and over, adult: Secondary | ICD-10-CM | POA: Diagnosis not present

## 2020-12-31 DIAGNOSIS — J029 Acute pharyngitis, unspecified: Secondary | ICD-10-CM | POA: Diagnosis not present

## 2020-12-31 DIAGNOSIS — J020 Streptococcal pharyngitis: Secondary | ICD-10-CM | POA: Diagnosis not present

## 2021-08-14 IMAGING — DX DG CHEST 1V PORT
1 series · 1 of 1 positions shown · non-contrast
Comparison: [id number]

CLINICAL DATA: Cough

EXAM:
PORTABLE CHEST 1 VIEW

[chest ap grid]
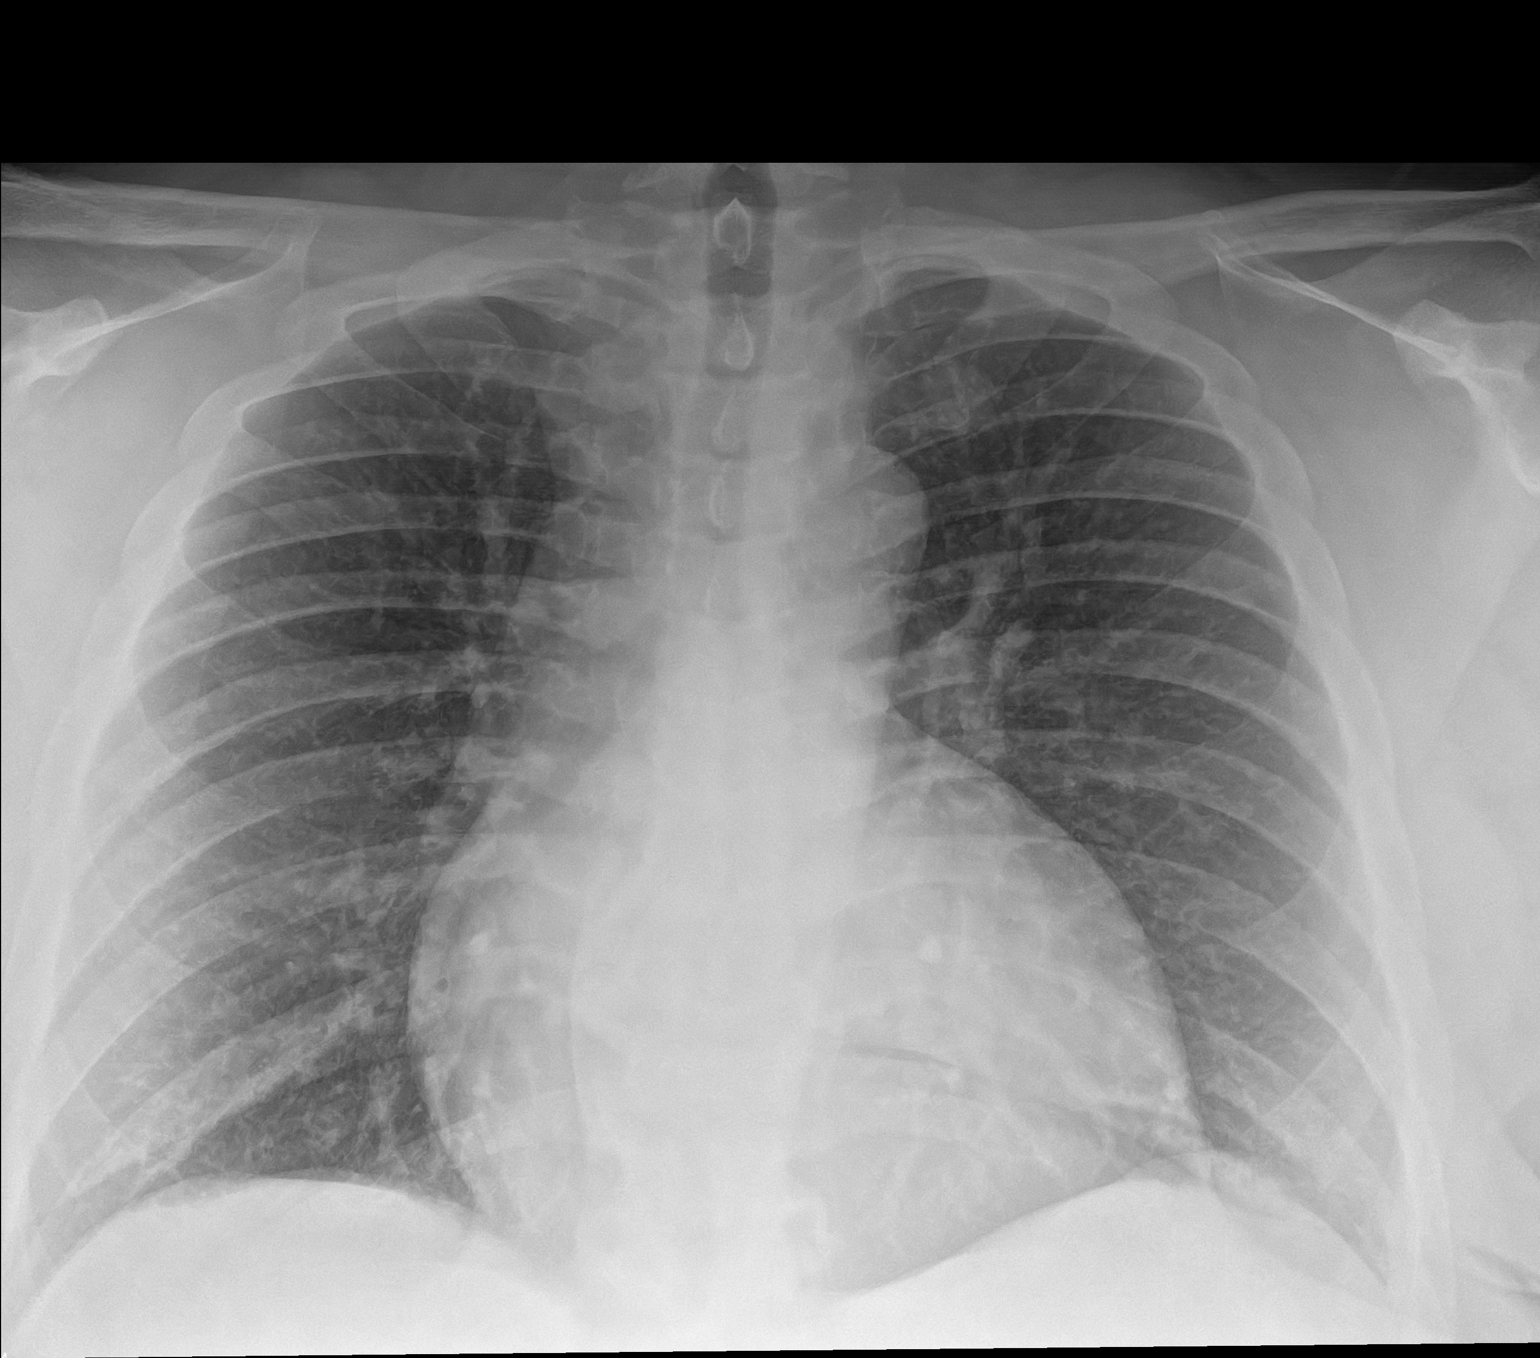

[1 of 1 positions shown; findings below may reference images not displayed]

FINDINGS: The heart size and mediastinal contours are likely within normal
limits for technique. Both lungs are clear. No pleural effusion. The
visualized skeletal structures are unremarkable.
IMPRESSION: No acute process in the chest.

## 2021-08-22 DIAGNOSIS — S43491A Other sprain of right shoulder joint, initial encounter: Secondary | ICD-10-CM | POA: Diagnosis not present

## 2021-12-28 DIAGNOSIS — M778 Other enthesopathies, not elsewhere classified: Secondary | ICD-10-CM | POA: Diagnosis not present

## 2022-01-27 DIAGNOSIS — Z720 Tobacco use: Secondary | ICD-10-CM | POA: Diagnosis not present

## 2022-01-27 DIAGNOSIS — E669 Obesity, unspecified: Secondary | ICD-10-CM | POA: Diagnosis not present

## 2022-01-27 DIAGNOSIS — I160 Hypertensive urgency: Secondary | ICD-10-CM | POA: Diagnosis not present

## 2022-01-27 DIAGNOSIS — F17210 Nicotine dependence, cigarettes, uncomplicated: Secondary | ICD-10-CM | POA: Diagnosis not present

## 2022-01-27 DIAGNOSIS — Z888 Allergy status to other drugs, medicaments and biological substances status: Secondary | ICD-10-CM | POA: Diagnosis not present

## 2022-01-27 DIAGNOSIS — I10 Essential (primary) hypertension: Secondary | ICD-10-CM | POA: Diagnosis not present

## 2022-01-27 DIAGNOSIS — Z886 Allergy status to analgesic agent status: Secondary | ICD-10-CM | POA: Diagnosis not present

## 2022-02-15 DIAGNOSIS — I10 Essential (primary) hypertension: Secondary | ICD-10-CM | POA: Diagnosis not present

## 2022-02-15 DIAGNOSIS — Z0001 Encounter for general adult medical examination with abnormal findings: Secondary | ICD-10-CM | POA: Diagnosis not present

## 2022-04-12 DIAGNOSIS — R200 Anesthesia of skin: Secondary | ICD-10-CM | POA: Diagnosis not present

## 2022-04-12 DIAGNOSIS — Z886 Allergy status to analgesic agent status: Secondary | ICD-10-CM | POA: Diagnosis not present

## 2022-04-12 DIAGNOSIS — F17210 Nicotine dependence, cigarettes, uncomplicated: Secondary | ICD-10-CM | POA: Diagnosis not present

## 2022-04-12 DIAGNOSIS — S46811A Strain of other muscles, fascia and tendons at shoulder and upper arm level, right arm, initial encounter: Secondary | ICD-10-CM | POA: Diagnosis not present

## 2022-04-12 DIAGNOSIS — S46812A Strain of other muscles, fascia and tendons at shoulder and upper arm level, left arm, initial encounter: Secondary | ICD-10-CM | POA: Diagnosis not present

## 2022-04-12 DIAGNOSIS — Z6841 Body Mass Index (BMI) 40.0 and over, adult: Secondary | ICD-10-CM | POA: Diagnosis not present

## 2022-04-12 DIAGNOSIS — M25511 Pain in right shoulder: Secondary | ICD-10-CM | POA: Diagnosis not present

## 2022-04-12 DIAGNOSIS — I10 Essential (primary) hypertension: Secondary | ICD-10-CM | POA: Diagnosis not present

## 2022-04-12 DIAGNOSIS — R202 Paresthesia of skin: Secondary | ICD-10-CM | POA: Diagnosis not present

## 2022-04-12 DIAGNOSIS — E669 Obesity, unspecified: Secondary | ICD-10-CM | POA: Diagnosis not present

## 2022-04-12 DIAGNOSIS — S29012A Strain of muscle and tendon of back wall of thorax, initial encounter: Secondary | ICD-10-CM | POA: Diagnosis not present

## 2022-04-12 DIAGNOSIS — X58XXXA Exposure to other specified factors, initial encounter: Secondary | ICD-10-CM | POA: Diagnosis not present

## 2023-04-19 DIAGNOSIS — Z0000 Encounter for general adult medical examination without abnormal findings: Secondary | ICD-10-CM | POA: Diagnosis not present

## 2023-04-19 DIAGNOSIS — E038 Other specified hypothyroidism: Secondary | ICD-10-CM | POA: Diagnosis not present

## 2023-04-19 DIAGNOSIS — Z6841 Body Mass Index (BMI) 40.0 and over, adult: Secondary | ICD-10-CM | POA: Diagnosis not present

## 2023-04-19 DIAGNOSIS — E782 Mixed hyperlipidemia: Secondary | ICD-10-CM | POA: Diagnosis not present

## 2023-04-19 DIAGNOSIS — Z79899 Other long term (current) drug therapy: Secondary | ICD-10-CM | POA: Diagnosis not present

## 2023-04-24 ENCOUNTER — Encounter (INDEPENDENT_AMBULATORY_CARE_PROVIDER_SITE_OTHER): Payer: Self-pay | Admitting: *Deleted

## 2023-07-14 ENCOUNTER — Emergency Department (HOSPITAL_COMMUNITY)
Admission: EM | Admit: 2023-07-14 | Discharge: 2023-07-14 | Disposition: A | Discharge status: Home / Self Care | Payer: BC Managed Care – PPO | Attending: Emergency Medicine | Admitting: Emergency Medicine

## 2023-07-14 ENCOUNTER — Emergency Department (HOSPITAL_COMMUNITY): Payer: BC Managed Care – PPO

## 2023-07-14 ENCOUNTER — Encounter (HOSPITAL_COMMUNITY): Payer: Self-pay

## 2023-07-14 ENCOUNTER — Other Ambulatory Visit: Payer: Self-pay

## 2023-07-14 DIAGNOSIS — Y9239 Other specified sports and athletic area as the place of occurrence of the external cause: Secondary | ICD-10-CM | POA: Insufficient documentation

## 2023-07-14 DIAGNOSIS — M25532 Pain in left wrist: Secondary | ICD-10-CM | POA: Diagnosis not present

## 2023-07-14 DIAGNOSIS — M25531 Pain in right wrist: Secondary | ICD-10-CM | POA: Diagnosis not present

## 2023-07-14 DIAGNOSIS — Z79899 Other long term (current) drug therapy: Secondary | ICD-10-CM | POA: Diagnosis not present

## 2023-07-14 DIAGNOSIS — W228XXA Striking against or struck by other objects, initial encounter: Secondary | ICD-10-CM | POA: Diagnosis not present

## 2023-07-14 DIAGNOSIS — I10 Essential (primary) hypertension: Secondary | ICD-10-CM | POA: Insufficient documentation

## 2023-07-14 DIAGNOSIS — S6992XA Unspecified injury of left wrist, hand and finger(s), initial encounter: Secondary | ICD-10-CM | POA: Diagnosis not present

## 2023-07-14 MED ORDER — IBUPROFEN 400 MG PO TABS
600.0000 mg | ORAL_TABLET | Freq: Once | ORAL | Status: AC
  Administered 2023-07-14: 600 mg via ORAL
  Filled 2023-07-14: qty 2

## 2023-07-14 MED ORDER — IBUPROFEN 400 MG PO TABS: 400.0000 mg | ORAL_TABLET | Freq: Four times a day (QID) | ORAL | 0 refills | Status: AC | PRN

## 2023-07-14 MED ORDER — OXYCODONE-ACETAMINOPHEN 5-325 MG PO TABS
1.0000 | ORAL_TABLET | Freq: Once | ORAL | Status: AC
  Administered 2023-07-14: 1 via ORAL
  Filled 2023-07-14: qty 1

## 2023-07-14 NOTE — Discharge Instructions (Signed)
You were seen today for wrist/hand pain and swelling.  Use brace as needed for comfort.  Ice and elevate.  Take ibuprofen as needed for pain.  You may have an inflammatory arthritis such as gout although your x-ray does suggest some ligamentous disruption in the wrist joint.  For this reason you need to follow-up with hand surgery.

## 2023-07-14 NOTE — ED Provider Notes (Signed)
[place] EMERGENCY DEPARTMENT AT [facility] Provider Note   CSN: [id number] Arrival date & time: 07/14/23  0007     History  Chief Complaint  Patient presents with   Hand Injury    Walter Stuart is a 48 y.o. male.  HPI    This is a 48 year old male who presents with left hand and wrist pain and swelling.  Patient reports progressive on and off swelling over the last 3 weeks.  He is unsure of a specific injury but states he may have hit his wrist on a piece of weight equipment at the gym.  He reports that it is swollen and painful.  He has difficulty doing everyday tasks.  He is right-handed.  Reports pain with range of motion of the wrist.  Mostly the pain is on the palmar aspect of the wrist.  However he has swelling throughout the wrist and hand.  No fevers.  No systemic symptoms.  Does report a history of gout.  However, he denies any recent alcohol use or use of pork products.  Home Medications Prior to Admission medications   Medication Sig Start Date End Date Taking? Authorizing Provider  ibuprofen (ADVIL) 400 MG tablet Take 1 tablet (400 mg total) by mouth every 6 (six) hours as needed. 07/14/23  Yes Bocephus Cali, Mayer Masker, MD  amLODipine (NORVASC) 5 MG tablet Take 1 tablet (5 mg total) by mouth daily. For blood pressure 03/24/20   Mancel Bale, MD  doxycycline (VIBRAMYCIN) 100 MG capsule Take 1 capsule (100 mg total) by mouth 2 (two) times daily. One po bid x 7 days 11/20/19   Bethann Berkshire, MD  labetalol (NORMODYNE) 200 MG tablet Take 1 tablet (200 mg total) by mouth 2 (two) times daily. 03/24/20   Mancel Bale, MD      Allergies    Lisinopril    Review of Systems   Review of Systems  Constitutional:  Negative for fever.  Respiratory:  Negative for shortness of breath.   Cardiovascular:  Negative for chest pain.  Musculoskeletal:        Hand and wrist pain  Skin:  Negative for color change.  All other systems reviewed and are negative.   Physical  Exam Updated Vital Signs BP (!) 179/115 (BP Location: Right Arm)   Pulse 87   Temp 98.9 F (37.2 C) (Oral)   Resp 19   Ht 1.702 m (5\' 7" )   Wt 133.4 kg   SpO2 98%   BMI 46.05 kg/m  Physical Exam Vitals and nursing note reviewed.  Constitutional:      Appearance: He is well-developed. He is obese. He is ill-appearing.  HENT:     Head: Normocephalic and atraumatic.  Eyes:     Pupils: Pupils are equal, round, and reactive to light.  Cardiovascular:     Rate and Rhythm: Normal rate and regular rhythm.  Pulmonary:     Effort: Pulmonary effort is normal. No respiratory distress.  Abdominal:     Palpations: Abdomen is soft.  Musculoskeletal:     Cervical back: Neck supple.     Comments: Focused examination of the left wrist with tenderness to palpation of the hand and wrist with swelling noted over the dorsum of the hand and throughout the wrist.  There is no overlying skin changes.  Pain with flexion and extension of all 5 digits as well as flexion and extension at the wrist.  No warmth or erythema.  2+ radial pulse  Skin:  General: Skin is warm and dry.  Neurological:     Mental Status: He is alert and oriented to person, place, and time.  Psychiatric:        Mood and Affect: Mood normal.     ED Results / Procedures / Treatments   Labs (all labs ordered are listed, but only abnormal results are displayed) Labs Reviewed - No data to display  EKG None  Radiology DG Hand Complete Left Result Date: 07/14/2023 CLINICAL DATA:  Left hand injury, swelling EXAM: LEFT HAND - COMPLETE 3+ VIEW COMPARISON:  None Available. FINDINGS: There is widening of the scapholunate interval suggesting disruption of the scapholunate ligament. No acute fracture identified. Joint spaces otherwise preserved. No lytic or blastic bone lesions. There is diffuse, extensive soft tissue swelling left hand and visualized wrist. IMPRESSION: 1. Widening of the scapholunate interval suggesting disruption of  the scapholunate ligament. 2. Diffuse, extensive soft tissue swelling. Electronically Signed   By: Helyn Numbers M.D.   On: 07/14/2023 01:27    Procedures Procedures    Medications Ordered in ED Medications  ibuprofen (ADVIL) tablet 600 mg (600 mg Oral Given 07/14/23 0330)  oxyCODONE-acetaminophen (PERCOCET/ROXICET) 5-325 MG per tablet 1 tablet (1 tablet Oral Given 07/14/23 0330)    ED Course/ Medical Decision Making/ A&P                                 Medical Decision Making Amount and/or Complexity of Data Reviewed Radiology: ordered.  Risk Prescription drug management.   This patient presents to the ED for concern of left hand and wrist pain, this involves an extensive number of treatment options, and is a complaint that carries with it a high risk of complications and morbidity.  I considered the following differential and admission for this acute, potentially life threatening condition.  The differential diagnosis includes inflammatory arthritis, septic arthritis, injury  MDM:    This is a 48 year old male who presents with left hand and wrist pain and swelling.  He is nontoxic and vital signs are reassuring.  He reports progressive symptoms over the last several weeks.  There are no signs or symptoms on exam that are consistent with septic arthritis.  Could be inflammatory arthritis such as gout.  X-rays do show widening of the scapholunate ligament.  This could suggest injury although patient is unsure and if injured likely remote.  Patient was placed in a splint for comfort.  Recommend ice and elevation as well as anti-inflammatory medications.  He will be given hand surgery follow-up.  Return precautions given.  (Labs, imaging, consults)  Labs: I Ordered, and personally interpreted labs.  The pertinent results include: None  Imaging Studies ordered: I ordered imaging studies including x-ray I independently visualized and interpreted imaging. I agree with the  radiologist interpretation  Additional history obtained from chart review.  External records from outside source obtained and reviewed including prior evaluations  Cardiac Monitoring: The patient was not maintained on a cardiac monitor.  If on the cardiac monitor, I personally viewed and interpreted the cardiac monitored which showed an underlying rhythm of: N/A  Reevaluation: After the interventions noted above, I reevaluated the patient and found that they have :stayed the same  Social Determinants of Health:  lives independently  Disposition: Discharge  Co morbidities that complicate the patient evaluation  Past Medical History:  Diagnosis Date   Gout 2011   Hypertension 02-2015   Meningitis  past hx. ? seizure"passed out"     Medicines Meds ordered this encounter  Medications   ibuprofen (ADVIL) tablet 600 mg   oxyCODONE-acetaminophen (PERCOCET/ROXICET) 5-325 MG per tablet 1 tablet    Refill:  0   ibuprofen (ADVIL) 400 MG tablet    Sig: Take 1 tablet (400 mg total) by mouth every 6 (six) hours as needed.    Dispense:  30 tablet    Refill:  0    I have reviewed the patients home medicines and have made adjustments as needed  Problem List / ED Course: Problem List Items Addressed This Visit   None Visit Diagnoses       Right wrist pain    -  Primary                   Final Clinical Impression(s) / ED Diagnoses Final diagnoses:  Right wrist pain    Rx / DC Orders ED Discharge Orders          Ordered    ibuprofen (ADVIL) 400 MG tablet  Every 6 hours PRN        07/14/23 0338              Shon Baton, MD 07/14/23 [phone]

## 2023-07-14 NOTE — ED Notes (Signed)
Wrist brace applied

## 2023-07-14 NOTE — ED Triage Notes (Signed)
Pov from home cc of left hand injury. Says he hit it at the gym 3 weeks ago and it isnt getting any better. Swollen and painful to the touch.

## 2023-07-26 DIAGNOSIS — R936 Abnormal findings on diagnostic imaging of limbs: Secondary | ICD-10-CM | POA: Diagnosis not present

## 2023-07-26 DIAGNOSIS — N182 Chronic kidney disease, stage 2 (mild): Secondary | ICD-10-CM | POA: Diagnosis not present

## 2023-07-26 DIAGNOSIS — I161 Hypertensive emergency: Secondary | ICD-10-CM | POA: Diagnosis not present

## 2023-07-26 DIAGNOSIS — M778 Other enthesopathies, not elsewhere classified: Secondary | ICD-10-CM | POA: Diagnosis not present

## 2023-07-26 DIAGNOSIS — M25532 Pain in left wrist: Secondary | ICD-10-CM | POA: Diagnosis not present

## 2023-07-26 DIAGNOSIS — M1009 Idiopathic gout, multiple sites: Secondary | ICD-10-CM | POA: Diagnosis not present

## 2023-07-26 DIAGNOSIS — M1812 Unilateral primary osteoarthritis of first carpometacarpal joint, left hand: Secondary | ICD-10-CM | POA: Diagnosis not present

## 2023-08-02 DIAGNOSIS — S63592D Other specified sprain of left wrist, subsequent encounter: Secondary | ICD-10-CM | POA: Diagnosis not present

## 2023-08-02 DIAGNOSIS — N182 Chronic kidney disease, stage 2 (mild): Secondary | ICD-10-CM | POA: Diagnosis not present

## 2023-08-02 DIAGNOSIS — I161 Hypertensive emergency: Secondary | ICD-10-CM | POA: Diagnosis not present

## 2023-08-09 DIAGNOSIS — M25532 Pain in left wrist: Secondary | ICD-10-CM | POA: Diagnosis not present

## 2023-08-09 DIAGNOSIS — S63392A Traumatic rupture of other ligament of left wrist, initial encounter: Secondary | ICD-10-CM | POA: Diagnosis not present

## 2023-09-17 ENCOUNTER — Other Ambulatory Visit (HOSPITAL_COMMUNITY): Payer: Self-pay | Admitting: Orthopedic Surgery

## 2023-09-17 DIAGNOSIS — M25532 Pain in left wrist: Secondary | ICD-10-CM

## 2023-09-19 DIAGNOSIS — L84 Corns and callosities: Secondary | ICD-10-CM | POA: Diagnosis not present

## 2023-09-19 DIAGNOSIS — Z6841 Body Mass Index (BMI) 40.0 and over, adult: Secondary | ICD-10-CM | POA: Diagnosis not present

## 2023-09-19 DIAGNOSIS — M1009 Idiopathic gout, multiple sites: Secondary | ICD-10-CM | POA: Diagnosis not present

## 2023-09-22 ENCOUNTER — Ambulatory Visit (HOSPITAL_COMMUNITY): Admission: RE | Admit: 2023-09-22 | Source: Ambulatory Visit

## 2023-09-22 ENCOUNTER — Encounter (HOSPITAL_COMMUNITY): Payer: Self-pay

## 2023-09-29 ENCOUNTER — Ambulatory Visit (HOSPITAL_COMMUNITY)
Admission: RE | Admit: 2023-09-29 | Discharge: 2023-09-29 | Disposition: A | Discharge status: Home / Self Care | Source: Ambulatory Visit | Attending: Orthopedic Surgery | Admitting: Orthopedic Surgery

## 2023-09-29 DIAGNOSIS — M25532 Pain in left wrist: Secondary | ICD-10-CM | POA: Insufficient documentation

## 2023-09-29 DIAGNOSIS — M65942 Unspecified synovitis and tenosynovitis, left hand: Secondary | ICD-10-CM | POA: Diagnosis not present

## 2023-09-29 DIAGNOSIS — S63592A Other specified sprain of left wrist, initial encounter: Secondary | ICD-10-CM | POA: Diagnosis not present

## 2023-10-10 DIAGNOSIS — Z6841 Body Mass Index (BMI) 40.0 and over, adult: Secondary | ICD-10-CM | POA: Diagnosis not present

## 2023-10-10 DIAGNOSIS — M10172 Lead-induced gout, left ankle and foot: Secondary | ICD-10-CM | POA: Diagnosis not present

## 2023-10-23 ENCOUNTER — Encounter (INDEPENDENT_AMBULATORY_CARE_PROVIDER_SITE_OTHER): Payer: Self-pay | Admitting: *Deleted

## 2024-01-24 DIAGNOSIS — N23 Unspecified renal colic: Secondary | ICD-10-CM | POA: Diagnosis not present

## 2024-01-24 DIAGNOSIS — F17210 Nicotine dependence, cigarettes, uncomplicated: Secondary | ICD-10-CM | POA: Diagnosis not present

## 2024-01-24 DIAGNOSIS — N1831 Chronic kidney disease, stage 3a: Secondary | ICD-10-CM | POA: Diagnosis not present

## 2024-01-24 DIAGNOSIS — M545 Low back pain, unspecified: Secondary | ICD-10-CM | POA: Diagnosis not present

## 2024-01-24 DIAGNOSIS — M549 Dorsalgia, unspecified: Secondary | ICD-10-CM | POA: Diagnosis not present

## 2024-01-24 DIAGNOSIS — I129 Hypertensive chronic kidney disease with stage 1 through stage 4 chronic kidney disease, or unspecified chronic kidney disease: Secondary | ICD-10-CM | POA: Diagnosis not present

## 2024-01-24 DIAGNOSIS — N183 Chronic kidney disease, stage 3 unspecified: Secondary | ICD-10-CM | POA: Diagnosis not present

## 2024-01-24 DIAGNOSIS — I10 Essential (primary) hypertension: Secondary | ICD-10-CM | POA: Diagnosis not present

## 2024-01-24 DIAGNOSIS — R109 Unspecified abdominal pain: Secondary | ICD-10-CM | POA: Diagnosis not present

## 2024-01-25 DIAGNOSIS — R109 Unspecified abdominal pain: Secondary | ICD-10-CM | POA: Diagnosis not present

## 2024-01-25 DIAGNOSIS — M549 Dorsalgia, unspecified: Secondary | ICD-10-CM | POA: Diagnosis not present

## 2024-01-25 DIAGNOSIS — N1831 Chronic kidney disease, stage 3a: Secondary | ICD-10-CM | POA: Diagnosis not present

## 2024-03-04 ENCOUNTER — Encounter (INDEPENDENT_AMBULATORY_CARE_PROVIDER_SITE_OTHER): Payer: Self-pay | Admitting: *Deleted

## 2024-03-12 DIAGNOSIS — M5431 Sciatica, right side: Secondary | ICD-10-CM | POA: Diagnosis not present

## 2024-03-12 DIAGNOSIS — Z6841 Body Mass Index (BMI) 40.0 and over, adult: Secondary | ICD-10-CM | POA: Diagnosis not present
